# Patient Record
Sex: Male | Born: 1980 | Race: Black or African American | Hispanic: No | Marital: Single | Smoking: Current some day smoker
Health system: Southern US, Community
[De-identification: ages and names within clinical notes are randomized; demographics above are authoritative.]

## PROBLEM LIST (undated history)

## (undated) HISTORY — PX: ROOT CANAL: SHX2363

---
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
## 1998-06-17 ENCOUNTER — Emergency Department (HOSPITAL_COMMUNITY): Admission: EM | Admit: 1998-06-17 | Discharge: 1998-06-17 | Payer: Self-pay | Admitting: Emergency Medicine

## 1998-06-17 ENCOUNTER — Encounter: Payer: Self-pay | Admitting: Emergency Medicine

## 2000-08-07 ENCOUNTER — Inpatient Hospital Stay (HOSPITAL_COMMUNITY): Admission: EM | Admit: 2000-08-07 | Discharge: 2000-08-08 | Payer: Self-pay | Admitting: Emergency Medicine

## 2000-08-07 ENCOUNTER — Encounter: Payer: Self-pay | Admitting: Emergency Medicine

## 2001-08-12 ENCOUNTER — Emergency Department (HOSPITAL_COMMUNITY): Admission: EM | Admit: 2001-08-12 | Discharge: 2001-08-12 | Payer: Self-pay | Admitting: Emergency Medicine

## 2001-08-12 ENCOUNTER — Encounter: Payer: Self-pay | Admitting: Emergency Medicine

## 2002-01-23 ENCOUNTER — Emergency Department (HOSPITAL_COMMUNITY): Admission: EM | Admit: 2002-01-23 | Discharge: 2002-01-23 | Payer: Self-pay | Admitting: Emergency Medicine

## 2003-03-16 ENCOUNTER — Emergency Department (HOSPITAL_COMMUNITY): Admission: EM | Admit: 2003-03-16 | Discharge: 2003-03-16 | Payer: Self-pay | Admitting: Family Medicine

## 2009-11-16 ENCOUNTER — Emergency Department (HOSPITAL_COMMUNITY)
Admission: EM | Admit: 2009-11-16 | Discharge: 2009-11-16 | Payer: Self-pay | Source: Home / Self Care | Admitting: Emergency Medicine

## 2010-06-10 NOTE — H&P (Signed)
[place]  Patient:    Jacob Snow, Jacob Snow                        MRN: [id number] Adm. Date:  [id number] Attending:  Jaci Standard                         History and Physical  CHIEF COMPLAINT:  Nausea and vomiting.  HISTORY OF PRESENT ILLNESS:  A 30 year old black male, no significant past medical history, who started vomiting at 4 p.m. this afternoon.  Had nausea and vomiting x 10, diarrhea x 5.  Had quite a bit of abdominal pain and cramping to begin with, but things have improved quite a bit.  Denied any blood in the diarrhea or vomitus.  It is noted that he had [place] food at lunch.  The historian is the patient, who is very, very sleepy and drugged from all the Phenergan.  PAST MEDICAL HISTORY:  Hospitalizations:  None.  Surgeries:  None.  Illnesses: None.  MEDICATIONS:  None.  ALLERGIES:  None.  SOCIAL HISTORY:  Patient denies smoking, alcohol, or drug usage.  Patient is unmarried, has no children.  Works at _____ [employer].  FAMILY HISTORY:  Mother and father are living, and two sisters, living and well.  No early CAD, cancer, hypertension, diabetes in the family, or early strokes.  REVIEW OF SYSTEMS:  As above.  Also headache, which is improved.  No fevers or chills.  No recent weight loss.  No heart disease.  GASTROINTESTINAL:  As above.  GENITOURINARY:  Negative.  ORTHOPEDIC:  Negative.  PSYCHIATRIC: Negative.  PHYSICAL EXAMINATION:  VITAL SIGNS:  Blood pressure 120/66, pulse 95, respirations 20, temperature 97.7.  GENERAL:  This is a very sleepy black male, very difficult to get much history from him.  He is not in any acute distress at present time but, again, wants to sleep.  HEENT:  Eyes EOMI, PERRLA, fundi are benign.  Mucous membranes are somewhat dry.  NECK:  Supple without nodes, bruits, increased thyroid.  CHEST:  Clear.  CARDIAC:  Regular rate and rhythm without murmur, click, or rub.  ABDOMEN:  Soft,  good bowel sounds, no rebound, guarding, or masses noted. Abdomen is "sore."  EXTREMITIES:  No cyanosis, clubbing, or edema, 2+ pulses, symmetric bilateral.  NEUROLOGIC:  No focal deficits.  Deep tendon reflexes well-preserved.  Sensory and motor function all intact.  Gait is not tested.  LABORATORY DATA:  White count is 15.4 with shift.  Hemoglobin and hematocrit 17.8, 52.3.  Platelets 150.  UA:  Specific gravity greater than 1.040.  CT scan of the abdomen with and without contrast was negative.  Creatinine was 1.4, BUN 17, sodium and potassium 141 and 4.2, remainder of the Stat-8 was negative.  ASSESSMENT:  Gastroenteritis, probable food poisoning.  PLAN:  IV fluids, Phenergan.  For further plan, please see orders. DD:  08/07/00 TD:  08/07/00 Job: [id number]

## 2010-06-22 ENCOUNTER — Emergency Department (HOSPITAL_COMMUNITY)
Admission: EM | Admit: 2010-06-22 | Discharge: 2010-06-22 | Disposition: A | Discharge status: Home / Self Care | Payer: Self-pay | Attending: Emergency Medicine | Admitting: Emergency Medicine

## 2010-06-22 DIAGNOSIS — R05 Cough: Secondary | ICD-10-CM | POA: Insufficient documentation

## 2010-06-22 DIAGNOSIS — R059 Cough, unspecified: Secondary | ICD-10-CM | POA: Insufficient documentation

## 2010-06-22 DIAGNOSIS — J3489 Other specified disorders of nose and nasal sinuses: Secondary | ICD-10-CM | POA: Insufficient documentation

## 2010-06-22 DIAGNOSIS — J029 Acute pharyngitis, unspecified: Secondary | ICD-10-CM | POA: Insufficient documentation

## 2010-06-22 LAB — RAPID STREP SCREEN (MED CTR MEBANE ONLY): Streptococcus, Group A Screen (Direct): NEGATIVE

## 2010-07-03 ENCOUNTER — Emergency Department (HOSPITAL_COMMUNITY)
Admission: EM | Admit: 2010-07-03 | Discharge: 2010-07-03 | Disposition: A | Discharge status: Home / Self Care | Payer: Self-pay | Attending: Emergency Medicine | Admitting: Emergency Medicine

## 2010-07-03 ENCOUNTER — Emergency Department (HOSPITAL_COMMUNITY): Payer: Self-pay

## 2010-07-03 DIAGNOSIS — J029 Acute pharyngitis, unspecified: Secondary | ICD-10-CM | POA: Insufficient documentation

## 2010-07-03 LAB — POCT I-STAT, CHEM 8
Calcium, Ion: 1.2 mmol/L (ref 1.12–1.32)
Chloride: 102 mEq/L (ref 96–112)
HCT: 47 % (ref 39.0–52.0)
Potassium: 4.4 mEq/L (ref 3.5–5.1)
Sodium: 139 mEq/L (ref 135–145)

## 2010-07-03 LAB — RAPID STREP SCREEN (MED CTR MEBANE ONLY): Streptococcus, Group A Screen (Direct): NEGATIVE

## 2010-07-03 MED ORDER — IOHEXOL 300 MG/ML  SOLN: 75.0000 mL | Freq: Once | INTRAMUSCULAR | Status: DC | PRN

## 2011-04-09 ENCOUNTER — Emergency Department (HOSPITAL_COMMUNITY)
Admission: EM | Admit: 2011-04-09 | Discharge: 2011-04-09 | Disposition: A | Discharge status: Home / Self Care | Payer: Self-pay | Attending: Emergency Medicine | Admitting: Emergency Medicine

## 2011-04-09 ENCOUNTER — Emergency Department (HOSPITAL_COMMUNITY): Payer: Self-pay

## 2011-04-09 ENCOUNTER — Other Ambulatory Visit: Payer: Self-pay

## 2011-04-09 ENCOUNTER — Encounter (HOSPITAL_COMMUNITY): Payer: Self-pay | Admitting: *Deleted

## 2011-04-09 DIAGNOSIS — S0180XA Unspecified open wound of other part of head, initial encounter: Secondary | ICD-10-CM | POA: Insufficient documentation

## 2011-04-09 DIAGNOSIS — Y9289 Other specified places as the place of occurrence of the external cause: Secondary | ICD-10-CM | POA: Insufficient documentation

## 2011-04-09 DIAGNOSIS — Z23 Encounter for immunization: Secondary | ICD-10-CM | POA: Insufficient documentation

## 2011-04-09 DIAGNOSIS — S0181XA Laceration without foreign body of other part of head, initial encounter: Secondary | ICD-10-CM

## 2011-04-09 MED ORDER — TETANUS-DIPHTH-ACELL PERTUSSIS 5-2.5-18.5 LF-MCG/0.5 IM SUSP
0.5000 mL | Freq: Once | INTRAMUSCULAR | Status: AC
  Administered 2011-04-09: 0.5 mL via INTRAMUSCULAR
  Filled 2011-04-09: qty 0.5

## 2011-04-09 NOTE — ED Notes (Signed)
Last tetanus shot in the 1990's.
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
## 2011-04-09 NOTE — ED Notes (Signed)
Patient currently asleep in bed; no respiratory or acute distress noted.  Will continue to monitor. 

## 2011-04-09 NOTE — ED Notes (Signed)
Patient back from CT; currently sitting up in bed; no respiratory or acute distress noted.  Patient updated on plan of care; informed patient that we are going to perform an EKG and that the EDP will be in to suture laceration.  Patient has no other questions or concerns at this time; will continue to monitor.

## 2011-04-09 NOTE — Discharge Instructions (Signed)
Laceration Care, Adult A laceration is a cut or lesion that goes through all layers of the skin and into the tissue just beneath the skin. TREATMENT  Some lacerations may not require closure. Some lacerations may not be able to be closed due to an increased risk of infection. It is important to see your caregiver as soon as possible after an injury to minimize the risk of infection and maximize the opportunity for successful closure. If closure is appropriate, pain medicines may be given, if needed. The wound will be cleaned to help prevent infection. Your caregiver will use stitches (sutures), staples, wound glue (adhesive), or skin adhesive strips to repair the laceration. These tools bring the skin edges together to allow for faster healing and a better cosmetic outcome. However, all wounds will heal with a scar. Once the wound has healed, scarring can be minimized by covering the wound with sunscreen during the day for 1 full year. HOME CARE INSTRUCTIONS  For sutures or staples:  Keep the wound clean and dry.   If you were given a bandage (dressing), you should change it at least once a day. Also, change the dressing if it becomes wet or dirty, or as directed by your caregiver.   Wash the wound with soap and water 2 times a day. Rinse the wound off with water to remove all soap. Pat the wound dry with a clean towel.   After cleaning, apply a thin layer of the antibiotic ointment as recommended by your caregiver. This will help prevent infection and keep the dressing from sticking.   You may shower as usual after the first 24 hours. Do not soak the wound in water until the sutures are removed.   Only take over-the-counter or prescription medicines for pain, discomfort, or fever as directed by your caregiver.   Get your sutures or staples removed as directed by your caregiver.  For skin adhesive strips:  Keep the wound clean and dry.   Do not get the skin adhesive strips wet. You may bathe  carefully, using caution to keep the wound dry.   If the wound gets wet, pat it dry with a clean towel.   Skin adhesive strips will fall off on their own. You may trim the strips as the wound heals. Do not remove skin adhesive strips that are still stuck to the wound. They will fall off in time.  For wound adhesive:  You may briefly wet your wound in the shower or bath. Do not soak or scrub the wound. Do not swim. Avoid periods of heavy perspiration until the skin adhesive has fallen off on its own. After showering or bathing, gently pat the wound dry with a clean towel.   Do not apply liquid medicine, cream medicine, or ointment medicine to your wound while the skin adhesive is in place. This may loosen the film before your wound is healed.   If a dressing is placed over the wound, be careful not to apply tape directly over the skin adhesive. This may cause the adhesive to be pulled off before the wound is healed.   Avoid prolonged exposure to sunlight or tanning lamps while the skin adhesive is in place. Exposure to ultraviolet light in the first year will darken the scar.   The skin adhesive will usually remain in place for 5 to 10 days, then naturally fall off the skin. Do not pick at the adhesive film.  You may need a tetanus shot if:  You   cannot remember when you had your last tetanus shot.   You have never had a tetanus shot.  If you get a tetanus shot, your arm may swell, get red, and feel warm to the touch. This is common and not a problem. If you need a tetanus shot and you choose not to have one, there is a rare chance of getting tetanus. Sickness from tetanus can be serious. SEEK MEDICAL CARE IF:   You have redness, swelling, or increasing pain in the wound.   You see a red line that goes away from the wound.   You have yellowish-white fluid (pus) coming from the wound.   You have a fever.   You notice a bad smell coming from the wound or dressing.   Your wound breaks  open before or after sutures have been removed.   You notice something coming out of the wound such as wood or glass.   Your wound is on your hand or foot and you cannot move a finger or toe.  SEEK IMMEDIATE MEDICAL CARE IF:   Your pain is not controlled with prescribed medicine.   You have severe swelling around the wound causing pain and numbness or a change in color in your arm, hand, leg, or foot.   Your wound splits open and starts bleeding.   You have worsening numbness, weakness, or loss of function of any joint around or beyond the wound.   You develop painful lumps near the wound or on the skin anywhere on your body.  MAKE SURE YOU:   Understand these instructions.   Will watch your condition.   Will get help right away if you are not doing well or get worse.  Document Released: 01/09/2005 Document Revised: 12/29/2010 Document Reviewed: 07/05/2010 [facility] Patient Information 2012 [place], [place].Assault, General Assault includes any behavior, whether intentional or reckless, which results in bodily injury to another person and/or damage to property. Included in this would be any behavior, intentional or reckless, that by its nature would be understood (interpreted) by a reasonable person as intent to harm another person or to damage his/her property. Threats may be oral or written. They may be communicated through regular mail, computer, fax, or phone. These threats may be direct or implied. FORMS OF ASSAULT INCLUDE:  Physically assaulting a person. This includes physical threats to inflict physical harm as well as:   Slapping.   Hitting.   Poking.   Kicking.   Punching.   Pushing.   Arson.   Sabotage.   Equipment vandalism.   Damaging or destroying property.   Throwing or hitting objects.   Displaying a weapon or an object that appears to be a weapon in a threatening manner.   Carrying a firearm of any kind.   Using a weapon to harm someone.    Using greater physical size/strength to intimidate another.   Making intimidating or threatening gestures.   Bullying.   Hazing.   Intimidating, threatening, hostile, or abusive language directed toward another person.   It communicates the intention to engage in violence against that person. And it leads a reasonable person to expect that violent behavior may occur.   Stalking another person.  IF IT HAPPENS AGAIN:  Immediately call for emergency help (911 in [place]   If someone poses clear and immediate danger to you, seek legal authorities to have a protective or restraining order put in place.   Less threatening assaults can at least be reported to authorities.  STEPS  TO TAKE IF A SEXUAL ASSAULT HAS HAPPENED  Go to an area of safety. This may include a shelter or staying with a friend. Stay away from the area where you have been attacked. A large percentage of sexual assaults are caused by a friend, relative or associate.   If medications were given by your caregiver, take them as directed for the full length of time prescribed.   Only take over-the-counter or prescription medicines for pain, discomfort, or fever as directed by your caregiver.   If you have come in contact with a sexual disease, find out if you are to be tested again. If your caregiver is concerned about the HIV/AIDS virus, he/she may require you to have continued testing for several months.   For the protection of your privacy, test results can not be given over the phone. Make sure you receive the results of your test. If your test results are not back during your visit, make an appointment with your caregiver to find out the results. Do not assume everything is normal if you have not heard from your caregiver or the medical facility. It is important for you to follow up on all of your test results.   File appropriate papers with authorities. This is important in all assaults, even if it has occurred in a  family or by a friend.  SEEK MEDICAL CARE IF:  You have new problems because of your injuries.   You have problems that may be because of the medicine you are taking, such as:   Rash.   Itching.   Swelling.   Trouble breathing.   You develop belly (abdominal) pain, feel sick to your stomach (nausea) or are vomiting.   You begin to run a temperature.   You need supportive care or referral to a rape crisis center. These are centers with trained personnel who can help you get through this ordeal.  SEEK IMMEDIATE MEDICAL CARE IF:  You are afraid of being threatened, beaten, or abused. In [place] call 911.   You receive new injuries related to abuse.   You develop severe pain in any area injured in the assault or have any change in your condition that concerns you.   You faint or lose consciousness.   You develop chest pain or shortness of breath.  Document Released: 01/09/2005 Document Revised: 12/29/2010 Document Reviewed: 08/28/2007 [facility] Patient Information 2012 [place], [place].

## 2011-04-09 NOTE — ED Notes (Signed)
Two small puncture wounds noted above left eye with some soft tissue swelling, bleeding controlled, patient alert and orientedx3, resting quietly
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
## 2011-04-09 NOTE — ED Notes (Signed)
Pharmacy at bedside
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
## 2011-04-09 NOTE — ED Notes (Signed)
Dr. Bebe Shaggy inserted several sutures to wounds.  Patient understands care for sutures at home.

## 2011-04-09 NOTE — ED Notes (Signed)
Dr. Wickline at bedside.  

## 2012-05-26 ENCOUNTER — Encounter (HOSPITAL_COMMUNITY): Payer: Self-pay | Admitting: *Deleted

## 2012-05-26 ENCOUNTER — Emergency Department (HOSPITAL_COMMUNITY)
Admission: EM | Admit: 2012-05-26 | Discharge: 2012-05-26 | Disposition: A | Discharge status: Home / Self Care | Payer: BC Managed Care – PPO | Attending: Emergency Medicine | Admitting: Emergency Medicine

## 2012-05-26 DIAGNOSIS — K089 Disorder of teeth and supporting structures, unspecified: Secondary | ICD-10-CM | POA: Insufficient documentation

## 2012-05-26 DIAGNOSIS — K0889 Other specified disorders of teeth and supporting structures: Secondary | ICD-10-CM

## 2012-05-26 DIAGNOSIS — K029 Dental caries, unspecified: Secondary | ICD-10-CM | POA: Insufficient documentation

## 2012-05-26 MED ORDER — METHYLPREDNISOLONE SODIUM SUCC 125 MG IJ SOLR
125.0000 mg | Freq: Once | INTRAMUSCULAR | Status: AC
  Administered 2012-05-26: 125 mg via INTRAMUSCULAR
  Filled 2012-05-26: qty 2

## 2012-05-26 MED ORDER — OXYCODONE-ACETAMINOPHEN 5-325 MG PO TABS: 1.0000 | ORAL_TABLET | Freq: Four times a day (QID) | ORAL | Status: DC | PRN

## 2012-05-26 NOTE — ED Notes (Signed)
Pt states having L sided top dental pain, seen dentist for it, was given a filling in tooth, went back d/t pain and dentist did a filling in tooth beside it, didn't address the tooth with the pain, states having swelling in jaw from pain.

## 2012-05-26 NOTE — ED Provider Notes (Signed)
Medical screening examination/treatment/procedure(s) were performed by non-physician practitioner and as supervising physician I was immediately available for consultation/collaboration.  Flint Melter, MD 05/26/12 [phone]

## 2012-05-26 NOTE — ED Provider Notes (Signed)
History     CSN: [id number]  Arrival date & time 05/26/12  1313   First MD Initiated Contact with Patient 05/26/12 1414      Chief Complaint  Patient presents with  . Dental Pain    (Consider location/radiation/quality/duration/timing/severity/associated sxs/prior treatment) HPI  Patient presents to the emergency department with a dental complaint. Symptoms began a few days ago. The patient has tried to alleviate pain with amoxicillin and hydrocodone.  Pain rated at a 10/10, characterized as throbbing in nature and located left upper tooth. Pt has seen two dentist this week. One day they did some dental work but it didn't help, the second visit with a different dentist, he knew the problem but the patient does not have the money to fix it. He is now out of pain and worried ab out swelling.. Patient denies fever, night sweats, chills, difficulty swallowing or opening mouth, SOB, nuchal rigidity or decreased ROM of neck.  Patient does not have a dentist and requests a resource guide at discharge.    History reviewed. No pertinent past medical history.  History reviewed. No pertinent past surgical history.  No family history on file.  History  Substance Use Topics  . Smoking status: Never Smoker   . Smokeless tobacco: Never Used  . Alcohol Use: Yes      Review of Systems  All other systems reviewed and are negative.    Allergies  Review of patient's allergies indicates no known allergies.  Home Medications   Current Outpatient Rx  Name  Route  Sig  Dispense  Refill  . amoxicillin (AMOXIL) 500 MG capsule   Oral   Take 500 mg by mouth 3 (three) times daily.         Marland Kitchen HYDROcodone-ibuprofen (VICOPROFEN) 7.5-200 MG per tablet   Oral   Take 2 tablets by mouth every 6 (six) hours as needed for pain.           BP 131/68  Pulse 64  Temp(Src) 98.4 F (36.9 C) (Oral)  Resp 16  SpO2 98%  Physical Exam  Nursing note and vitals reviewed. Constitutional: He appears  well-developed and well-nourished.  HENT:  Head: Normocephalic and atraumatic.  Mouth/Throat: Dental caries present.    Eyes: Conjunctivae and EOM are normal. Pupils are equal, round, and reactive to light.  Neck: Normal range of motion. Neck supple.  Cardiovascular: Normal rate and regular rhythm.   Pulmonary/Chest: Effort normal and breath sounds normal.    ED Course  Procedures (including critical care time)  Labs Reviewed - No data to display No results found.   No diagnosis found. Dx: toothache   MDM  Patient has dental pain. No emergent s/sx's present. Patent airway. No trismus.  Already on pain medication and antibiotics. Given shot for swelling in ED. I discussed the need to call dentist within 24/48 hours for follow-up. Dental referral given. Return to ED precautions given.  Pt voiced understanding and has agreed to follow-up.          Dorthula Matas, PA-C 05/26/12 1424

## 2012-09-03 ENCOUNTER — Encounter (HOSPITAL_COMMUNITY): Payer: Self-pay

## 2012-09-03 ENCOUNTER — Emergency Department (HOSPITAL_COMMUNITY)
Admission: EM | Admit: 2012-09-03 | Discharge: 2012-09-04 | Disposition: A | Discharge status: Home / Self Care | Payer: BLUE CROSS/BLUE SHIELD | Attending: Emergency Medicine | Admitting: Emergency Medicine

## 2012-09-03 DIAGNOSIS — K029 Dental caries, unspecified: Secondary | ICD-10-CM | POA: Insufficient documentation

## 2012-09-03 DIAGNOSIS — K089 Disorder of teeth and supporting structures, unspecified: Secondary | ICD-10-CM | POA: Diagnosis present

## 2012-09-03 DIAGNOSIS — K047 Periapical abscess without sinus: Secondary | ICD-10-CM | POA: Diagnosis not present

## 2012-09-03 MED ORDER — PENICILLIN V POTASSIUM 500 MG PO TABS: 500.0000 mg | ORAL_TABLET | Freq: Three times a day (TID) | ORAL | Status: DC

## 2012-09-03 MED ORDER — KETOROLAC TROMETHAMINE 60 MG/2ML IM SOLN
60.0000 mg | Freq: Once | INTRAMUSCULAR | Status: AC
  Administered 2012-09-04: 60 mg via INTRAMUSCULAR
  Filled 2012-09-03: qty 2

## 2012-09-03 MED ORDER — HYDROCODONE-IBUPROFEN 7.5-200 MG PO TABS: 2.0000 | ORAL_TABLET | Freq: Four times a day (QID) | ORAL | Status: DC | PRN

## 2012-09-03 NOTE — ED Provider Notes (Signed)
CSN: [id number]     Arrival date & time 09/03/12  2325 History    This chart was scribed for a non-physician practitioner, Fayrene Helper, PA-C, working with Jones Skene, MD by Frederik Pear, ED Scribe. This patient was seen in room WTR6/WTR6 and the patient's care was started at 2329.   First MD Initiated Contact with Patient 09/03/12 2329     Chief Complaint  Patient presents with  . Dental Pain   (Consider location/radiation/quality/duration/timing/severity/associated sxs/prior Treatment) The history is provided by the patient and medical records. No language interpreter was used.   HPI Comments: Jacob Snow is a 32 y.o. male who presents to the Emergency Department complaining of worsening left-sided lower dental pain thatis aggravated by eating sweet foods and alleviated by nothing that began 1 day ago. He has a h/o of similar pain to the same tooth 4-5 months ago. He was seen in ED and given a course of amoxicillin and hydrocodone and dentist referral, but did not follow up because the pain stopped. He reports he has an appointment with an oral surgeon, Dr. Veryl Speak, in 3 days. He has treated the pain at home by gargling salt water and peroxide. He also took  [facility] powder without relief.   History reviewed. No pertinent past medical history. History reviewed. No pertinent past surgical history. History reviewed. No pertinent family history. History  Substance Use Topics  . Smoking status: Never Smoker   . Smokeless tobacco: Never Used  . Alcohol Use: Yes    Review of Systems  HENT: Positive for dental problem.   All other systems reviewed and are negative.   Allergies  Review of patient's allergies indicates no known allergies.  Home Medications   Current Outpatient Rx  Name  Route  Sig  Dispense  Refill  . amoxicillin (AMOXIL) 500 MG capsule   Oral   Take 500 mg by mouth 3 (three) times daily.         Marland Kitchen HYDROcodone-ibuprofen (VICOPROFEN) 7.5-200 MG per tablet   Oral   Take 2 tablets by mouth every 6 (six) hours as needed for pain.         Marland Kitchen oxyCODONE-acetaminophen (PERCOCET/ROXICET) 5-325 MG per tablet   Oral   Take 1 tablet by mouth every 6 (six) hours as needed for pain.   12 tablet   0    BP 134/74  Pulse 64  Temp(Src) 99.6 F (37.6 C) (Oral)  Resp 18  SpO2 99% Physical Exam  Nursing note and vitals reviewed. Constitutional: He is oriented to person, place, and time. He appears well-developed and well-nourished. No distress.  HENT:  Head: Normocephalic and atraumatic. No trismus in the jaw.  Mouth/Throat: Uvula is midline and oropharynx is clear and moist. No oral lesions. Abnormal dentition. No dental abscesses. No oropharyngeal exudate, posterior oropharyngeal edema, posterior oropharyngeal erythema or tonsillar abscesses.  Marked dental decay on the left lower first molar. Moderate tenderness to the adjacent buccal region.  Eyes: EOM are normal. Pupils are equal, round, and reactive to light.  Neck: Normal range of motion. Neck supple. No tracheal deviation present.  Cardiovascular: Normal rate.   Pulmonary/Chest: Effort normal. No respiratory distress.  Abdominal: Soft. He exhibits no distension.  Musculoskeletal: Normal range of motion. He exhibits no edema.  Neurological: He is alert and oriented to person, place, and time.  Skin: Skin is warm and dry.  Psychiatric: He has a normal mood and affect. His behavior is normal.    ED Course  Procedures (including critical care time)  DIAGNOSTIC STUDIES: Oxygen Saturation is 99% on room air, normal by my interpretation.    COORDINATION OF CARE:  23:52- Discussed planned course of treatment in the ED with the patient, including Toradol and Penicillin, who is agreeable at this time. Discharge instructions for the pt include keeping his appointment with the oral surgeon and returning if he develops difficulty swallowing, breathing, or fever. He is agreeable at this time.  Medications   ketorolac (TORADOL) injection 60 mg (not administered)   Labs Reviewed - No data to display No results found. 1. Periapical abscess with facial involvement     MDM  BP 134/74  Pulse 64  Temp(Src) 99.6 F (37.6 C) (Oral)  Resp 18  SpO2 99%   I personally performed the services described in this documentation, which was scribed in my presence. The recorded information has been reviewed and is accurate.     Fayrene Helper, PA-C 09/04/12 0000

## 2012-09-03 NOTE — ED Notes (Signed)
Pt complains of wisdom tooth pain, he's currently on vicodin and amoxicillian, has an appt Friday to see an oral surgeon to have tooth extracted.

## 2012-09-04 ENCOUNTER — Emergency Department (HOSPITAL_COMMUNITY)
Admission: EM | Admit: 2012-09-04 | Discharge: 2012-09-04 | Disposition: A | Discharge status: Home / Self Care | Payer: BC Managed Care – PPO | Attending: Emergency Medicine | Admitting: Emergency Medicine

## 2012-09-04 ENCOUNTER — Encounter (HOSPITAL_COMMUNITY): Payer: Self-pay | Admitting: Emergency Medicine

## 2012-09-04 DIAGNOSIS — K089 Disorder of teeth and supporting structures, unspecified: Secondary | ICD-10-CM | POA: Insufficient documentation

## 2012-09-04 DIAGNOSIS — K0889 Other specified disorders of teeth and supporting structures: Secondary | ICD-10-CM

## 2012-09-04 DIAGNOSIS — K029 Dental caries, unspecified: Secondary | ICD-10-CM | POA: Insufficient documentation

## 2012-09-04 MED ORDER — KETOROLAC TROMETHAMINE 30 MG/ML IJ SOLN
30.0000 mg | Freq: Once | INTRAMUSCULAR | Status: AC
  Administered 2012-09-04: 30 mg via INTRAMUSCULAR
  Filled 2012-09-04: qty 1

## 2012-09-04 NOTE — ED Provider Notes (Signed)
CSN: [id number]     Arrival date & time 09/04/12  1526 History     First MD Initiated Contact with Patient 09/04/12 1539     Chief Complaint  Patient presents with  . Dental Pain   (Consider location/radiation/quality/duration/timing/severity/associated sxs/prior Treatment) HPI  Pt returns to the ER today for the same "pain shot" he received yesterday. Toradol 30mg  IM for his dental pain. He see's an oral surgeon tomorrow but the Percocet he was prescribed is not helping his pain. The pain is aggravated by eating.  Patient presents to the emergency department with a dental complaint. Symptoms began 3 days ago. The patient has tried to alleviate pain with Percocet and Amoxicillin.  Pain rated at a 10/10, characterized as throbbing in nature and located left lower side. Patient denies fever, night sweats, chills, difficulty swallowing or opening mouth, SOB, nuchal rigidity or decreased ROM of neck.  Patient does not have a dentist and requests a resource guide at discharge.   History reviewed. No pertinent past medical history. History reviewed. No pertinent past surgical history. History reviewed. No pertinent family history. History  Substance Use Topics  . Smoking status: Never Smoker   . Smokeless tobacco: Never Used  . Alcohol Use: Yes     Comment: occ    Review of Systems ROS is negative unless otherwise stated in the HPI  Allergies  Review of patient's allergies indicates no known allergies.  Home Medications   Current Outpatient Rx  Name  Route  Sig  Dispense  Refill  . HYDROcodone-ibuprofen (VICOPROFEN) 7.5-200 MG per tablet   Oral   Take 2 tablets by mouth every 6 (six) hours as needed for pain.   12 tablet   0   . ibuprofen (ADVIL,MOTRIN) 200 MG tablet   Oral   Take 600 mg by mouth every 6 (six) hours as needed for pain.         Marland Kitchen penicillin v potassium (VEETID) 500 MG tablet   Oral   Take 1 tablet (500 mg total) by mouth 3 (three) times daily.   30  tablet   0    BP 141/88  Pulse 63  Temp(Src) 98.8 F (37.1 C) (Oral)  SpO2 100% Physical Exam  Nursing note and vitals reviewed. Constitutional: He appears well-developed and well-nourished.  HENT:  Head: Normocephalic and atraumatic.  Mouth/Throat: Dental caries present.    Eyes: Conjunctivae and EOM are normal. Pupils are equal, round, and reactive to light.  Neck: Normal range of motion. Neck supple.  Cardiovascular: Normal rate and regular rhythm.   Pulmonary/Chest: Effort normal and breath sounds normal.    ED Course   Procedures (including critical care time)  Labs Reviewed - No data to display No results found. 1. Toothache     MDM  Patient has dental pain. No emergent s/sx's present. Patent airway. No trismus.  Will be given pain medication and antibiotics. I discussed the need to call dentist within 24/48 hours for follow-up. Dental referral given. Return to ED precautions given.  Pt voiced understanding and has agreed to follow-up.   32 y.o.Rafiq Lawler's evaluation in the Emergency Department is complete. It has been determined that no acute conditions requiring further emergency intervention are present at this time. The patient/guardian have been advised of the diagnosis and plan. We have discussed signs and symptoms that warrant return to the ED, such as changes or worsening in symptoms.  Vital signs are stable at discharge. Filed Vitals:   09/04/12 1534  BP: 141/88  Pulse: 63  Temp: 98.8 F (37.1 C)    Patient/guardian has voiced understanding and agreed to follow-up with the PCP or specialist.   Dorthula Matas, PA-C 09/04/12 1600

## 2012-09-04 NOTE — ED Provider Notes (Signed)
Medical screening examination/treatment/procedure(s) were performed by non-physician practitioner and as supervising physician I was immediately available for consultation/collaboration.  John-Adam Kadija Cruzen, M.D.     John-Adam Darius Lundberg, MD 09/04/12 0600 

## 2012-09-04 NOTE — ED Provider Notes (Signed)
Medical screening examination/treatment/procedure(s) were performed by non-physician practitioner and as supervising physician I was immediately available for consultation/collaboration.  Layla Maw Ward, DO 09/04/12 1621

## 2012-09-04 NOTE — ED Notes (Signed)
Toradol 30 mg IM to left deltoid.  Patient tolerated well. Bandaid to site.

## 2013-05-28 ENCOUNTER — Emergency Department (HOSPITAL_COMMUNITY)
Admission: EM | Admit: 2013-05-28 | Discharge: 2013-05-28 | Disposition: A | Discharge status: Home / Self Care | Payer: BC Managed Care – PPO | Attending: Emergency Medicine | Admitting: Emergency Medicine

## 2013-05-28 ENCOUNTER — Encounter (HOSPITAL_COMMUNITY): Payer: Self-pay | Admitting: Emergency Medicine

## 2013-05-28 DIAGNOSIS — G479 Sleep disorder, unspecified: Secondary | ICD-10-CM | POA: Insufficient documentation

## 2013-05-28 DIAGNOSIS — H9209 Otalgia, unspecified ear: Secondary | ICD-10-CM | POA: Insufficient documentation

## 2013-05-28 DIAGNOSIS — J3489 Other specified disorders of nose and nasal sinuses: Secondary | ICD-10-CM | POA: Insufficient documentation

## 2013-05-28 DIAGNOSIS — K0889 Other specified disorders of teeth and supporting structures: Secondary | ICD-10-CM

## 2013-05-28 DIAGNOSIS — Z792 Long term (current) use of antibiotics: Secondary | ICD-10-CM | POA: Insufficient documentation

## 2013-05-28 DIAGNOSIS — K089 Disorder of teeth and supporting structures, unspecified: Secondary | ICD-10-CM | POA: Insufficient documentation

## 2013-05-28 MED ORDER — AMOXICILLIN 500 MG PO CAPS: 500.0000 mg | ORAL_CAPSULE | Freq: Three times a day (TID) | ORAL | Status: DC

## 2013-05-28 NOTE — ED Provider Notes (Signed)
CSN: [id number]     Arrival date & time 05/28/13  1739 History  This chart was scribed for non-physician practitioner, Roxy Horsemanobert Yohan Samons, PA-C, working with Ethelda ChickMartha K Linker, MD by Charline BillsEssence Howell, ED Scribe. This patient was seen in room WTR6/WTR6 and the patient's care was started at 7:17 PM.    Chief Complaint  Patient presents with  . Dental Pain  . Nasal Congestion    The history is provided by the patient. No language interpreter was used.   HPI Comments: Rod MaeDaimen Bazzle is a 33 y.o. male who presents to the Emergency Department with chief complaint of left sided dental pain onset today. Pt also reports associated sinus pressure, ear pain and jaw pain onset 2 days ago. Pt has tried ibuprofen and putting a sealant on his tooth with no relief. He has not recently seen a dentist; last visit was in August. Pt does not have a h/o allergies to any antibiotics.   History reviewed. No pertinent past medical history. No past surgical history on file. No family history on file. History  Substance Use Topics  . Smoking status: Never Smoker   . Smokeless tobacco: Never Used  . Alcohol Use: Yes     Comment: occ    Review of Systems  Constitutional: Negative for fever and chills.  HENT: Positive for dental problem, ear pain and sinus pressure. Negative for drooling.   Neurological: Negative for speech difficulty.  Psychiatric/Behavioral: Positive for sleep disturbance.     Allergies  Review of patient's allergies indicates no known allergies.  Home Medications   Prior to Admission medications   Medication Sig Start Date End Date Taking? Authorizing Provider  HYDROcodone-ibuprofen (VICOPROFEN) 7.5-200 MG per tablet Take 2 tablets by mouth every 6 (six) hours as needed for pain. 09/03/12   Fayrene HelperBowie Tran, PA-C  ibuprofen (ADVIL,MOTRIN) 200 MG tablet Take 600 mg by mouth every 6 (six) hours as needed for pain.    Historical Provider, MD  penicillin v potassium (VEETID) 500 MG tablet Take 1 tablet  (500 mg total) by mouth 3 (three) times daily. 09/03/12   Fayrene HelperBowie Tran, PA-C   Triage Vitals: BP 139/90  Pulse 59  Temp(Src) 98.4 F (36.9 C) (Oral)  Resp 12  SpO2 100% Physical Exam  Nursing note and vitals reviewed. Constitutional: He is oriented to person, place, and time. He appears well-developed and well-nourished.  HENT:  Head: Normocephalic and atraumatic.  Mouth/Throat:    Poor dentition throughout.  Affected tooth as diagrammed.  No signs of peritonsillar or tonsillar abscess.  No signs of gingival abscess. Oropharynx is clear and without exudates.  Uvula is midline.  Airway is intact. No signs of Ludwig's angina with palpation of oral and sublingual mucosa.   Eyes: Conjunctivae and EOM are normal.  Neck: Normal range of motion.  Cardiovascular: Normal rate.   Pulmonary/Chest: Effort normal.  Abdominal: He exhibits no distension.  Musculoskeletal: Normal range of motion.  Neurological: He is alert and oriented to person, place, and time.  Skin: Skin is dry.  Psychiatric: He has a normal mood and affect. His behavior is normal. Judgment and thought content normal.    ED Course  Procedures (including critical care time) DIAGNOSTIC STUDIES: Oxygen Saturation is 100% on RA, normal by my interpretation.    COORDINATION OF CARE: 7:19 PM-Discussed treatment plan which includes antibiotic with pt at bedside and pt agreed to plan.   Labs Review Labs Reviewed - No data to display  Imaging Review No results found.  EKG Interpretation None      MDM   Final diagnoses:  Pain, dental    Patient with toothache.  No gross abscess.  Exam unconcerning for Ludwig's angina or spread of infection.  Will treat with amoxicillin and tylenol/ibuprofen.  Urged patient to follow-up with dentist.     I personally performed the services described in this documentation, which was scribed in my presence. The recorded information has been reviewed and is accurate.    Roxy Horsemanobert  Aerie Donica, PA-C 05/28/13 1936

## 2013-05-28 NOTE — Discharge Instructions (Signed)

## 2013-05-28 NOTE — ED Notes (Signed)
Pt states that he has had nasal congestion x 2 days.  Dental pain x 1 day.

## 2013-05-28 NOTE — ED Provider Notes (Signed)
Medical screening examination/treatment/procedure(s) were performed by non-physician practitioner and as supervising physician I was immediately available for consultation/collaboration.   EKG Interpretation None       Martha K Linker, MD 05/28/13 1938 
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
## 2013-11-11 ENCOUNTER — Encounter (HOSPITAL_COMMUNITY): Payer: Self-pay | Admitting: Emergency Medicine

## 2013-11-11 ENCOUNTER — Emergency Department (HOSPITAL_COMMUNITY)
Admission: EM | Admit: 2013-11-11 | Discharge: 2013-11-11 | Disposition: A | Discharge status: Home / Self Care | Payer: BC Managed Care – PPO | Attending: Emergency Medicine | Admitting: Emergency Medicine

## 2013-11-11 DIAGNOSIS — K029 Dental caries, unspecified: Secondary | ICD-10-CM | POA: Insufficient documentation

## 2013-11-11 DIAGNOSIS — Z792 Long term (current) use of antibiotics: Secondary | ICD-10-CM | POA: Insufficient documentation

## 2013-11-11 DIAGNOSIS — K047 Periapical abscess without sinus: Secondary | ICD-10-CM

## 2013-11-11 MED ORDER — AMOXICILLIN 500 MG PO CAPS: 500.0000 mg | ORAL_CAPSULE | Freq: Three times a day (TID) | ORAL | Status: DC

## 2013-11-11 MED ORDER — IBUPROFEN 600 MG PO TABS: 600.0000 mg | ORAL_TABLET | Freq: Four times a day (QID) | ORAL | Status: DC | PRN

## 2013-11-11 MED ORDER — HYDROCODONE-ACETAMINOPHEN 5-325 MG PO TABS
1.0000 | ORAL_TABLET | ORAL | Status: DC | PRN
Start: 2013-11-11 — End: 2017-06-21

## 2013-11-11 NOTE — Discharge Instructions (Signed)
Ibuprofen for pain. norco for severe pain. Amoxil until all gone for the infection. Follow up with a dentist as referred.    Abscess An abscess is an infected area that contains a collection of pus and debris.It can occur in almost any part of the body. An abscess is also known as a furuncle or boil. CAUSES  An abscess occurs when tissue gets infected. This can occur from blockage of oil or sweat glands, infection of hair follicles, or a minor injury to the skin. As the body tries to fight the infection, pus collects in the area and creates pressure under the skin. This pressure causes pain. People with weakened immune systems have difficulty fighting infections and get certain abscesses more often.  SYMPTOMS Usually an abscess develops on the skin and becomes a painful mass that is red, warm, and tender. If the abscess forms under the skin, you may feel a moveable soft area under the skin. Some abscesses break open (rupture) on their own, but most will continue to get worse without care. The infection can spread deeper into the body and eventually into the bloodstream, causing you to feel ill.  DIAGNOSIS  Your caregiver will take your medical history and perform a physical exam. A sample of fluid may also be taken from the abscess to determine what is causing your infection. TREATMENT  Your caregiver may prescribe antibiotic medicines to fight the infection. However, taking antibiotics alone usually does not cure an abscess. Your caregiver may need to make a small cut (incision) in the abscess to drain the pus. In some cases, gauze is packed into the abscess to reduce pain and to continue draining the area. HOME CARE INSTRUCTIONS   Only take over-the-counter or prescription medicines for pain, discomfort, or fever as directed by your caregiver.  If you were prescribed antibiotics, take them as directed. Finish them even if you start to feel better.  If gauze is used, follow your caregiver's  directions for changing the gauze.  To avoid spreading the infection:  Keep your draining abscess covered with a bandage.  Wash your hands well.  Do not share personal care items, towels, or whirlpools with others.  Avoid skin contact with others.  Keep your skin and clothes clean around the abscess.  Keep all follow-up appointments as directed by your caregiver. SEEK MEDICAL CARE IF:   You have increased pain, swelling, redness, fluid drainage, or bleeding.  You have muscle aches, chills, or a general ill feeling.  You have a fever. MAKE SURE YOU:   Understand these instructions.  Will watch your condition.  Will get help right away if you are not doing well or get worse. Document Released: 10/19/2004 Document Revised: 07/11/2011 Document Reviewed: 03/24/2011 [facility] Patient Information 2015 [place], [place]. This information is not intended to replace advice given to you by your health care provider. Make sure you discuss any questions you have with your health care provider.

## 2013-11-11 NOTE — ED Provider Notes (Signed)
CSN: [id number]     Arrival date & time 11/11/13  1226 History  This chart was scribed for Jacob Musselatyana A Vedh Ptacek, PA with Shon Batonourtney F Horton, MD by Tonye RoyaltyJoshua Chen, ED Scribe. This patient was seen in room WTR5/WTR5 and the patient's care was started at 1:08 PM.    No chief complaint on file.  The history is provided by the patient. No language interpreter was used.    HPI Comments: Jacob Snow is a 33 y.o. male who presents to the Emergency Department complaining of recurrent pain and swelling to his left lower jaw with onset yesterday. He states he does not normally use sugar but states he had some with coffee recently. He states his dentist recommended that he have a root canal a few months ago, but he did not have it done because of the cost. He reports using amoxicillin previously with remission. He denies fever.  No past medical history on file. No past surgical history on file. No family history on file. History  Substance Use Topics  . Smoking status: Never Smoker   . Smokeless tobacco: Never Used  . Alcohol Use: Yes     Comment: occ    Review of Systems  Constitutional: Negative for fever and chills.  HENT: Positive for dental problem and facial swelling.       Allergies  Review of patient's allergies indicates no known allergies.  Home Medications   Prior to Admission medications   Medication Sig Start Date End Date Taking? Authorizing Provider  amoxicillin (AMOXIL) 500 MG capsule Take 1 capsule (500 mg total) by mouth 3 (three) times daily. 05/28/13   Roxy Horsemanobert Browning, PA-C  HYDROcodone-ibuprofen (VICOPROFEN) 7.5-200 MG per tablet Take 2 tablets by mouth every 6 (six) hours as needed for pain. 09/03/12   Fayrene HelperBowie Tran, PA-C  ibuprofen (ADVIL,MOTRIN) 200 MG tablet Take 600 mg by mouth every 6 (six) hours as needed for pain.    Historical Provider, MD  penicillin v potassium (VEETID) 500 MG tablet Take 1 tablet (500 mg total) by mouth 3 (three) times daily. 09/03/12   Fayrene HelperBowie Tran,  PA-C   There were no vitals taken for this visit. Physical Exam  Nursing note and vitals reviewed. Constitutional: He is oriented to person, place, and time. He appears well-developed and well-nourished.  HENT:  Head: Normocephalic and atraumatic.  Left mandibular swelling. There is a dental care he is in the left lower first molar. It is tender to palpation. There is surrounding gum swelling, abscess. There is no swelling under the tongue. There is no trismus. No submandibular lymphadenopathy  Eyes: Conjunctivae are normal.  Neck: Normal range of motion. Neck supple.  Pulmonary/Chest: Effort normal.  Musculoskeletal: Normal range of motion.  Neurological: He is alert and oriented to person, place, and time.  Skin: Skin is warm and dry.  Psychiatric: He has a normal mood and affect.    ED Course  Procedures (including critical care time) Labs Review Labs Reviewed - No data to display  Imaging Review No results found.   EKG Interpretation None      COORDINATION OF CARE: 1:11 PM Discussed treatment plan with patient at beside, including amoxicillin and referral to an oral surgeon. Instructed him to return to the ED if symptoms do not improve while on antibiotics. The patient agrees with the plan and has no further questions at this time.   MDM   Final diagnoses:  None    Patient with a dental abscess. History of the same  in the same tooth. He has seen a dentist in the past who told him he needed a root canal, but patient did not followup. I this time he is afebrile, nontoxic appearing, and evidence of sepsis. No signs of Ludwig's angina. We'll start amoxicillin, pain management. Followup with a dentist, oral surgery referral given  Filed Vitals:   11/11/13 1300  BP: 105/52  Pulse: 66  Temp: 98.7 F (37.1 C)  TempSrc: Oral  Resp: 18  SpO2: 99%     I personally performed the services described in this documentation, which was scribed in my presence. The recorded  information has been reviewed and is accurate.    Jacob Musselatyana A Sebrena Engh, PA-C 11/11/13 1325

## 2013-11-11 NOTE — ED Notes (Signed)
Recurrent swelling and  pain in l/lower jaw. Area in l/lower jaw swollen, pt reports pain x 12 hours. Has been seen by dentist a few months ago and stated that root canal was recommended

## 2013-11-11 NOTE — ED Provider Notes (Signed)
Medical screening examination/treatment/procedure(s) were performed by non-physician practitioner and as supervising physician I was immediately available for consultation/collaboration.   EKG Interpretation None        Jacob Wesche F Hue Steveson, MD 11/11/13 1900 

## 2013-11-12 ENCOUNTER — Encounter (HOSPITAL_COMMUNITY): Payer: Self-pay | Admitting: Emergency Medicine

## 2013-11-12 ENCOUNTER — Emergency Department (HOSPITAL_COMMUNITY)
Admission: EM | Admit: 2013-11-12 | Discharge: 2013-11-12 | Disposition: A | Discharge status: Home / Self Care | Payer: BC Managed Care – PPO | Attending: Emergency Medicine | Admitting: Emergency Medicine

## 2013-11-12 DIAGNOSIS — T85848D Pain due to other internal prosthetic devices, implants and grafts, subsequent encounter: Secondary | ICD-10-CM

## 2013-11-12 DIAGNOSIS — K088 Other specified disorders of teeth and supporting structures: Secondary | ICD-10-CM | POA: Insufficient documentation

## 2013-11-12 DIAGNOSIS — Z965 Presence of tooth-root and mandibular implants: Secondary | ICD-10-CM | POA: Insufficient documentation

## 2013-11-12 DIAGNOSIS — Z720 Tobacco use: Secondary | ICD-10-CM | POA: Insufficient documentation

## 2013-11-12 DIAGNOSIS — Z792 Long term (current) use of antibiotics: Secondary | ICD-10-CM | POA: Insufficient documentation

## 2013-11-12 NOTE — ED Notes (Signed)
Pt c/o LL facial swelling and pain in jaw from dental abscess x 2 days and depression x 1 month.  Pain score 6/10. Pt was seen yesterday for abscess.  Sts seen by dentist a few months ago and stated that root canal was recommended.  Sts took antibiotics as prescribed, but would like it drained.  Denies SI/HI/Hallucinations.  Sts "I've been going through a rough period."

## 2013-11-12 NOTE — ED Notes (Signed)
Pt not in room after speaking with EDP

## 2013-11-12 NOTE — ED Notes (Signed)
MD at bedside. EDP KOHUT AT BS SPEAKING WITH PT

## 2013-11-12 NOTE — Discharge Instructions (Signed)
°Emergency Department Resource Guide °1) Find a Doctor and Pay Out of Pocket °Although you won't have to find out who is covered by your insurance plan, it is a good idea to ask around and get recommendations. You will then need to call the office and see if the doctor you have chosen will accept you as a new patient and what types of options they offer for patients who are self-pay. Some doctors offer discounts or will set up payment plans for their patients who do not have insurance, but you will need to ask so you aren't surprised when you get to your appointment. ° °2) Contact Your Local Health Department °Not all health departments have doctors that can see patients for sick visits, but many do, so it is worth a call to see if yours does. If you don't know where your local health department is, you can check in your phone book. The CDC also has a tool to help you locate your state's health department, and many state websites also have listings of all of their local health departments. ° °3) Find a Walk-in Clinic °If your illness is not likely to be very severe or complicated, you may want to try a walk in clinic. These are popping up all over the country in pharmacies, drugstores, and shopping centers. They're usually staffed by nurse practitioners or physician assistants that have been trained to treat common illnesses and complaints. They're usually fairly quick and inexpensive. However, if you have serious medical issues or chronic medical problems, these are probably not your best option. ° °No Primary Care Doctor: °- Call [facility] at  832-8000 - they can help you locate a primary care doctor that  accepts your insurance, provides certain services, etc. °- Physician Referral Service- 1-800-533-3463 ° °Chronic Pain Problems: °Organization         Address  Phone   Notes  °[facility]  (336) 297-2271 Patients need to be referred by their primary care doctor.  ° °Medication  Assistance: °Organization         Address  Phone   Notes  °[place] [facility] [address] °[place], [place] [address] (336) 641-8030 --Must be a resident of [place] °-- Must have NO insurance coverage whatsoever (no Medicaid/ Medicare, etc.) °-- The pt. MUST have a primary care doctor that directs their care regularly and follows them in the community °  °[facility]  (866) 331-1348   °[employer]  (888) 892-1162   ° °Agencies that provide inexpensive medical care: °Organization         Address  Phone   Notes  °[facility]  (336) 832-8035   °[facility]    (336) 832-7272   °[facility] [address] °[place], [place] [address] (336) 832-4777   °[facility] [address], °[place] (336) 271-4999   °[facility]    (336) 373-0678   °[facility]    (336) 272-1050   °[facility] ° [address], [place] Phone:  (336) 832-4444, Fax:  (336) 832-4440 Hours of Operation:  9 am - 6 pm, M-F.  Also accepts Medicaid/Medicare and self-pay.  °[facility] ° [address], [place] Phone: (336) 832-3150, Fax: (336) 832-3151. Hours of Operation:  8:30 am - 5:30 pm, M-F.  Also accepts Medicaid and self-pay.  °[facility] [place] [address], [place] Phone: (336) 878-6027   °[facility] [address], [place], [place] (336)723-1848, Ext. 123 Mondays & Thursdays: 7-9 AM.  First 15 patients are seen on a first come, first serve basis. °  ° °Medicaid-accepting [place] Providers: ° °Organization         Address  Phone   Notes  °[facility] [address], [place] (336) 641-2100 Also accepts self-pay patients.  °[facility] [address], [place] ° (336) 856-9996   °[facility] [address], [place]  (336) 288-8857   °[facility] [address] [place] [address], [place] (336) 299-7000   °Veita Bland [address], [place]  ° (336) 373-1557 Only accepts [place] Access Medicaid patients after they have their name applied to their card.  ° °Self-Pay (no insurance) in [place]: ° °Organization         Address  Phone   Notes  °Sickle Cell Patients, [facility] [address], [place] (336) 832-1970   °[facility] [address], [place] (336) 832-4400   °[facility] [place] ° [address], [place] (336) 992-4800   °[facility]/Dr. Osei-Bonsu ° [address] [place] [address], [place] or [address], [place] (336) 841-8500 Phone number for both [place] and [place] locations is the same.  °[facility] [address], [place] (336) 299-0000   °[facility] [address] [place] [address], [place] or [address] (336) 852-7530 °(336) 878-2260   °[facility] [address], [place] (336) 350-1642, phone; (336) 294-5005, fax Sees patients 1st and 3rd Saturday of every month.  Must not qualify for public or private insurance (i.e. Medicaid, Medicare, [place] Health Choice, Veterans' Benefits) • Household income should be no more than 200% of the poverty level •The clinic cannot treat you if you are pregnant or think you are pregnant • Sexually transmitted diseases are not treated at the clinic.  ° ° °Dental Care: °Organization         Address  Phone  Notes  °[place] [facility] [address], [place] (336) 641-6152 Accepts children up to age 21 who are enrolled in Medicaid or [place] Health Choice; pregnant women with a Medicaid card; and children who have applied for Medicaid or [place] Health Choice, but were declined, whose parents can pay a reduced fee at time of service.  °[place]  [facility] [place]  [address], [place] (336) 641-7733 Accepts children up to age 21 who are enrolled in Medicaid or [place] Health Choice; pregnant women with a Medicaid card; and children who have applied for Medicaid or [place] Health Choice, but were declined, whose parents can pay a reduced fee at time of service.  °[facility] ° [address], [place] (336) 641-4533 Patients are seen by appointment only. Walk-ins are not accepted. [facility] will see patients 18 years of age and older. °Monday - Tuesday (8am-5pm) °Most Wednesdays (8:30-5pm) °$30 per visit, cash only  °[facility] ° [address], [place] (336) 641-4533 Patients are seen by appointment only. Walk-ins are not accepted. [facility] will see patients 18 years of age and older. °One   Wednesday Evening (Monthly: Volunteer Based).  $30 per visit, cash only  °[facility]  (919) 537-3737 for adults; Children under age 4, call Graduate Pediatric Dentistry at (919) [phone]. Children aged 4-14, please call (919) 537-3737 to request a pediatric application. ° Dental services are provided in all areas of dental care including fillings, crowns and bridges, complete and partial dentures, implants, gum treatment, root canals, and extractions. Preventive care is also provided. Treatment is provided to both adults and children. °Patients are selected via a lottery and there is often a waiting list. °  °[facility] [address], °[place] ° (336) 763-8833 [email or web address] °  °[facility] [address], [place], [place] (336)723-1848, Ext. 123 Second and Fourth Thursday of each month, opens at 6:30 AM; Clinic ends at 9 AM.  Patients are seen on a first-come first-served basis, and a limited number are seen during each clinic.  ° °[facility] ° [address], [place], [place] (336) 723-7904    Eligibility Requirements °You must have lived in [place], [place], or [place] counties for at least the last three months. °  You cannot be eligible for state or federal sponsored healthcare insurance, including Veterans Administration, Medicaid, or Medicare. °  You generally cannot be eligible for healthcare insurance through your employer.  °  How to apply: °Eligibility screenings are held every Tuesday and Wednesday afternoon from 1:00 pm until 4:00 pm. You do not need an appointment for the interview!  °[facility] [address], [place], [place] 336-631-2330   °[facility] [facility]  336-342-8273   °[facility]  336-703-3100   °[facility]  336-570-6415   ° °Behavioral Health Resources in the Community: °Intensive Outpatient Programs °Organization         Address  Phone  Notes  °[facility] [address], [place], [place] 336-878-6098   °[facility] [address], [place], [place] 336-832-9800   °[facility] [address], [place], [place] ° 336-882-2125   °[facility] [address],  °[place], [place] 1-800-853-5163 or 336-641-4981   °Substance Abuse Resources °Organization         Address  Phone  Notes  °[facility]  336-882-2125   °[facility]  336-784-9470   °[facility]  336-285-9073   °[facility]  336-845-3988   °Residential & Outpatient Substance Abuse Program  1-800-659-3381   °Psychological Services °Organization         Address  Phone  Notes  °[facility]  336- 832-9600   °[facility]  336- 378-7881   °[facility] [address], [place] 1-800-853-5163 or 336-641-4981   ° °Mobile Crisis Teams °Organization         Address  Phone  Notes  °[facility], Mobile Crisis Care Unit  1-877-626-1772   °Assertive °[facility] ° [address]  [place], [place] 336-834-9664   °Sharon DeEsch [address] °[place] 336-554-5454   ° °Self-Help/Support Groups °Organization         Address  Phone             Notes  °[facility]  - variety of support groups  336- 373-1402 Call for more information  °Narcotics Anonymous (NA), [facility] [address], °[place]  2 meetings at this location  ° °  Residential Treatment Programs °Organization         Address  Phone  Notes  °[facility] [address],    °[place]  1-866-801-8205   °[facility] ° [address], [place], [place] 704-293-8524   °[facility] [address], [place] 336-845-3988 Admissions: 8am-3pm M-F  °[facility] [address]    °[place], [place] 336-841-1104   °[facility] [address], [place], [place] 336-379-7146   °[facility] [address]  °[place], [place] 336-285-9073   °[facility] Intensive Outpatient [address], [place], [place] 336-852-3033   °[facility] [address]  °[place], [place] 1-877-615-2722 or 336-784-9470   °[facility] [address] [place], [place] 336-227-7417 Accepts Medicaid  °[facility] [address]  °[place] 1-800-659-3381 Substance Abuse/Addiction Treatment  ° °[place] Behavioral Health Resources °Organization         Address  Phone  Notes  °[facility]  (888) 581-9988   °Julie Brannon, PhD [address] [place], [place]   (336) 349-5553 or (336) 951-0000   ° Behavioral   [address] °[place], [place] (336) 349-4454   °[facility] [address], [place], [place] (336) 342-8316 Insurance/Medicaid/sponsorship through [facility]  °[facility] [address]                                    [place], [place] (336) 342-8316 Therapy/tele-psych/case    °[facility] [address]  ° [place], [place] (336) 349-2233    °Dr. Arfeen  (336) 349-4544   °[facility] [place]  [employer] [place] [facility] 1) [address], [place] °2) [address], [place] °3)  [address], [place] (336) 349-3220 °(336) 342-7768 ° °(336) 342-8140   °[place] [facility] (336) 342-1394 or (336) 342-3537 (After Hours)    ° ° °

## 2013-11-19 NOTE — ED Provider Notes (Signed)
CSN: [id number]     Arrival date & time 11/12/13  0809 History   First MD Initiated Contact with Patient 11/12/13 [phone]     Chief Complaint  Patient presents with  . Dental Abscess   . Depression     (Consider location/radiation/quality/duration/timing/severity/associated sxs/prior Treatment) HPI  33 year old male with left facial pain and swelling. Patient was recently seen for toothache/dental infection in the emergency room a couple days ago. Prescribe antibiotics and given pain medicine. Reports symptoms have not significantly improved. No fevers or chills. Has not been able to follow-up with dentist or oral surgeon since last evaluated. No difficulty breathing or swallowing.  History reviewed. No pertinent past medical history. Past Surgical History  Procedure Laterality Date  . Root canal     History reviewed. No pertinent family history. History  Substance Use Topics  . Smoking status: Current Some Day Smoker  . Smokeless tobacco: Never Used  . Alcohol Use: Yes     Comment: occ    Review of Systems  All systems reviewed and negative, other than as noted in HPI.   Allergies  Shellfish allergy  Home Medications   Prior to Admission medications   Medication Sig Start Date End Date Taking? Authorizing Provider  amoxicillin (AMOXIL) 500 MG capsule Take 1 capsule (500 mg total) by mouth 3 (three) times daily. 11/11/13  Yes Tatyana A Kirichenko, PA-C  HYDROcodone-acetaminophen (NORCO/VICODIN) 5-325 MG per tablet Take 1 tablet by mouth every 4 (four) hours as needed for moderate pain or severe pain. 11/11/13  Yes Tatyana A Kirichenko, PA-C  ibuprofen (ADVIL,MOTRIN) 600 MG tablet Take 1 tablet (600 mg total) by mouth every 6 (six) hours as needed. 11/11/13  Yes Tatyana A Kirichenko, PA-C   BP 117/73  Pulse 78  Temp(Src) 98.7 F (37.1 C) (Oral)  Resp 14  SpO2 99% Physical Exam  Nursing note and vitals reviewed. Constitutional: He appears well-developed and  well-nourished. No distress.  HENT:  Minimal left facial fullness. Tenderness along the gumline below the left lower premolars. No discrete drainable collection. Posterior pharynx is clear. Uvula is midline. Handling secretions. Normal sounding phonation. Neck is supple.  Eyes: Conjunctivae are normal. Pupils are equal, round, and reactive to light. Right eye exhibits no discharge. Left eye exhibits no discharge.  Neck: Neck supple.  Cardiovascular: Normal rate, regular rhythm and normal heart sounds.  Exam reveals no gallop and no friction rub.   No murmur heard. Pulmonary/Chest: Effort normal and breath sounds normal. No respiratory distress.  Abdominal: Soft. He exhibits no distension. There is no tenderness.  Musculoskeletal: He exhibits no edema and no tenderness.  Neurological: He is alert.  Skin: Skin is warm and dry.  Psychiatric: He has a normal mood and affect. His behavior is normal. Thought content normal.    ED Course  Procedures (including critical care time) Labs Review Labs Reviewed - No data to display  Imaging Review No results found.   EKG Interpretation None      MDM   Final diagnoses:  Dental implant pain, subsequent encounter    33 year old male with dental pain. Recently seen for the same. He has some facial swelling, but no discrete drainable collection my exam. No evidence of significant airway compromise. Plan continued antibiotics. As needed pain medication. Dental follow-up. Patient also citing increasing stress. She denies any suicidal or homicidal ideation. He is never spoke with anybody concerning these issues before. Is not interested in speaking with TTS today. Resource list was provided.  Raeford RazorStephen Savian Mazon, MD 11/19/13 0930

## 2016-08-12 ENCOUNTER — Emergency Department (HOSPITAL_COMMUNITY)
Admission: EM | Admit: 2016-08-12 | Discharge: 2016-08-13 | Disposition: A | Discharge status: Home / Self Care | Payer: BLUE CROSS/BLUE SHIELD | Attending: Emergency Medicine | Admitting: Emergency Medicine

## 2016-08-12 ENCOUNTER — Encounter (HOSPITAL_COMMUNITY): Payer: Self-pay | Admitting: Emergency Medicine

## 2016-08-12 DIAGNOSIS — F17200 Nicotine dependence, unspecified, uncomplicated: Secondary | ICD-10-CM | POA: Insufficient documentation

## 2016-08-12 DIAGNOSIS — H60392 Other infective otitis externa, left ear: Secondary | ICD-10-CM

## 2016-08-12 DIAGNOSIS — Z79899 Other long term (current) drug therapy: Secondary | ICD-10-CM | POA: Insufficient documentation

## 2016-08-12 NOTE — ED Triage Notes (Signed)
Pt comes in with complaints of left ear pain that started over a week ago.  Denies drainage or hearing changes.  States he does clean his ears with a wash cloth but not q-tips. Small white particle noted in ear upon assessment.

## 2016-08-13 MED ORDER — OFLOXACIN 0.3 % OT SOLN: 10.0000 [drp] | Freq: Every day | OTIC | 0 refills | Status: AC

## 2016-08-13 NOTE — ED Notes (Signed)
Pt ambulatory and independent at discharge.  Verbalized understanding of discharge instructions 

## 2016-08-13 NOTE — ED Provider Notes (Signed)
[facility]-EMERGENCY DEPT Provider Note   CSN: [id number] Arrival date & time: 08/12/16  2300     History   Chief Complaint Chief Complaint  Patient presents with  . Otalgia    HPI Jacob Snow is a 36 y.o. male presenting with 2 weeks of foreign body sensation in the left ear which has been intermittent and at times achy. Patient reports progressive worsening over the past week and he decided to get seen. He denies fever, chills, congestion, or other symptoms.  HPI  History reviewed. No pertinent past medical history.  There are no active problems to display for this patient.   Past Surgical History:  Procedure Laterality Date  . ROOT CANAL         Home Medications    Prior to Admission medications   Medication Sig Start Date End Date Taking? Authorizing Provider  amoxicillin (AMOXIL) 500 MG capsule Take 1 capsule (500 mg total) by mouth 3 (three) times daily. 11/11/13   Kirichenko, [place] Fillersatyana, PA-C  HYDROcodone-acetaminophen (NORCO/VICODIN) 5-325 MG per tablet Take 1 tablet by mouth every 4 (four) hours as needed for moderate pain or severe pain. 11/11/13   Kirichenko, Tatyana, PA-C  ibuprofen (ADVIL,MOTRIN) 600 MG tablet Take 1 tablet (600 mg total) by mouth every 6 (six) hours as needed. 11/11/13   Kirichenko, Tatyana, PA-C  ofloxacin (FLOXIN) 0.3 % OTIC solution Place 10 drops into the left ear daily. 08/13/16 08/20/16  Georgiana ShoreMitchell, Zakariye Nee B, PA-C    Family History No family history on file.  Social History Social History  Substance Use Topics  . Smoking status: Current Some Day Smoker  . Smokeless tobacco: Never Used  . Alcohol use Yes     Comment: occ     Allergies   Shellfish allergy   Review of Systems Review of Systems  Constitutional: Negative for chills and fever.  HENT: Positive for ear pain. Negative for ear discharge, facial swelling, hearing loss, sinus pain, sinus pressure and sore throat.   Respiratory: Negative for cough, choking, chest  tightness, shortness of breath, wheezing and stridor.   Cardiovascular: Negative for chest pain and palpitations.  Gastrointestinal: Negative for abdominal pain, nausea and vomiting.  Musculoskeletal: Negative for arthralgias, back pain, joint swelling, myalgias, neck pain and neck stiffness.  Skin: Negative for color change, pallor, rash and wound.  Neurological: Negative for dizziness and headaches.     Physical Exam Updated Vital Signs BP 122/68 (BP Location: Left Arm)   Pulse 61   Temp 97.7 F (36.5 C) (Oral)   Resp 18   Ht 5\' 7"  (1.702 m)   Wt 71.7 kg (158 lb)   SpO2 100%   BMI 24.75 kg/m   Physical Exam  Constitutional: He appears well-developed and well-nourished. No distress.  Afebrile, nontoxic-appearing, sitting comfortably in chair in no acute distress.  HENT:  Head: Normocephalic and atraumatic.  Right Ear: External ear normal.  Left Ear: External ear normal.  Mouth/Throat: Oropharynx is clear and moist. No oropharyngeal exudate.  Normal tympanic membranes bilaterally. Left ear canal with small white unidentified foreign body. Small raised pustule at 6 o'clock midway into the left ear canal.  Eyes: Conjunctivae and EOM are normal.  Neck: Normal range of motion. Neck supple.  Cardiovascular: Normal rate, regular rhythm and normal heart sounds.   No murmur heard. Pulmonary/Chest: Effort normal and breath sounds normal. No stridor. No respiratory distress. He has no wheezes. He has no rales.  Abdominal: Soft. There is no tenderness.  Musculoskeletal: He exhibits no  edema.  Lymphadenopathy:    He has no cervical adenopathy.  Neurological: He is alert.  Skin: Skin is warm and dry. No rash noted. He is not diaphoretic. No erythema. No pallor.  Psychiatric: He has a normal mood and affect.  Nursing note and vitals reviewed.    ED Treatments / Results  Labs (all labs ordered are listed, but only abnormal results are displayed) Labs Reviewed - No data to  display  EKG  EKG Interpretation None       Radiology No results found.  Procedures Procedures (including critical care time)    Medications Ordered in ED Medications - No data to display   Initial Impression / Assessment and Plan / ED Course  I have reviewed the triage vital signs and the nursing notes.  Pertinent labs & imaging results that were available during my care of the patient were reviewed by me and considered in my medical decision making (see chart for details).    Patient presents with left ear discomfort and sensation of foreign body. On exam the ear canal appears irritated with small pustule which spontaneously drained when attempting to remove foreign body with curette.  Tympanic membranes are normal. Patient is afebrile nontoxic and well-appearing.  Will discharge with ear drops and follow-up with PCP as needed.  Discussed strict return precautions and advised to return to the emergency department if experiencing any new or worsening symptoms. Instructions were understood and patient agreed with discharge plan.  Final Clinical Impressions(s) / ED Diagnoses   Final diagnoses:  Other infective acute otitis externa of left ear    New Prescriptions Discharge Medication List as of 08/13/2016 12:55 AM    START taking these medications   Details  ofloxacin (FLOXIN) 0.3 % OTIC solution Place 10 drops into the left ear daily., Starting Sun 08/13/2016, Until Sun 08/20/2016, Print         Georgiana Shore, PA-C 08/13/16 6045    Raeford Razor, MD 08/20/16 [phone]

## 2016-08-13 NOTE — Discharge Instructions (Signed)
As discussed, use the ear drops daily. 10 drops once a day in the left ear. Follow up with a primary care provider as needed. Avoid putting anything in your ears including q-tips.  Return if symptoms worsen, pain increases, discharge from the ear, fever, chills, or other concerning symptoms in the meantime.

## 2017-01-29 ENCOUNTER — Ambulatory Visit: Payer: Self-pay | Admitting: Physician Assistant

## 2017-06-20 DIAGNOSIS — R0981 Nasal congestion: Secondary | ICD-10-CM | POA: Diagnosis present

## 2017-06-20 DIAGNOSIS — J069 Acute upper respiratory infection, unspecified: Secondary | ICD-10-CM | POA: Insufficient documentation

## 2017-06-20 DIAGNOSIS — F17200 Nicotine dependence, unspecified, uncomplicated: Secondary | ICD-10-CM | POA: Diagnosis not present

## 2017-06-20 DIAGNOSIS — M545 Low back pain: Secondary | ICD-10-CM | POA: Diagnosis not present

## 2017-06-21 ENCOUNTER — Emergency Department (HOSPITAL_COMMUNITY)
Admission: EM | Admit: 2017-06-21 | Discharge: 2017-06-21 | Disposition: A | Discharge status: Home / Self Care | Payer: BC Managed Care – PPO | Attending: Emergency Medicine | Admitting: Emergency Medicine

## 2017-06-21 ENCOUNTER — Emergency Department (HOSPITAL_COMMUNITY): Payer: BC Managed Care – PPO

## 2017-06-21 ENCOUNTER — Encounter (HOSPITAL_COMMUNITY): Payer: Self-pay

## 2017-06-21 DIAGNOSIS — M545 Low back pain, unspecified: Secondary | ICD-10-CM

## 2017-06-21 DIAGNOSIS — J069 Acute upper respiratory infection, unspecified: Secondary | ICD-10-CM

## 2017-06-21 LAB — URINALYSIS, ROUTINE W REFLEX MICROSCOPIC
Bilirubin Urine: NEGATIVE
Glucose, UA: NEGATIVE mg/dL
Hgb urine dipstick: NEGATIVE
KETONES UR: 5 mg/dL — AB
Leukocytes, UA: NEGATIVE
NITRITE: NEGATIVE
PROTEIN: NEGATIVE mg/dL
Specific Gravity, Urine: 1.034 — ABNORMAL HIGH (ref 1.005–1.030)
pH: 5 (ref 5.0–8.0)

## 2017-06-21 LAB — GROUP A STREP BY PCR: GROUP A STREP BY PCR: NOT DETECTED

## 2017-06-21 MED ORDER — PREDNISONE 20 MG PO TABS
60.0000 mg | ORAL_TABLET | Freq: Once | ORAL | Status: AC
  Administered 2017-06-21: 60 mg via ORAL
  Filled 2017-06-21: qty 3

## 2017-06-21 MED ORDER — FEXOFENADINE-PSEUDOEPHED ER 60-120 MG PO TB12: 1.0000 | ORAL_TABLET | Freq: Two times a day (BID) | ORAL | 0 refills | Status: DC

## 2017-06-21 MED ORDER — FLUTICASONE PROPIONATE 50 MCG/ACT NA SUSP: 1.0000 | Freq: Every day | NASAL | 0 refills | Status: DC

## 2017-06-21 NOTE — ED Provider Notes (Signed)
[facility]-EMERGENCY DEPT Provider Note   CSN: [id number] Arrival date & time: 06/20/17  2318     History   Chief Complaint Chief Complaint  Patient presents with  . flu like sx    HPI Jacob Snow is a 37 y.o. male.  Patient presents to the emergency department for evaluation of multiple problems.  Patient reports that for the last 2 days he has had significant sinus congestion and a sore throat.  He has not noticed fever.  He has not had any cough, chest congestion associated with the symptoms.  Patient also reports right flank discomfort.  This is been ongoing for 3 or 4 weeks.  He reports that this comes and goes, has not identified anything that causes it.  He has not had any urinary symptoms.  No history of kidney stone.     History reviewed. No pertinent past medical history.  There are no active problems to display for this patient.   Past Surgical History:  Procedure Laterality Date  . ROOT CANAL          Home Medications    Prior to Admission medications   Medication Sig Start Date End Date Taking? Authorizing Provider  ibuprofen (ADVIL,MOTRIN) 200 MG tablet Take 400 mg by mouth every 6 (six) hours as needed for headache or moderate pain.   Yes [provider]  fexofenadine-pseudoephedrine (ALLEGRA-D) 60-120 MG 12 hr tablet Take 1 tablet by mouth every 12 (twelve) hours. 06/21/17   Gilda Crease, MD  fluticasone (FLONASE) 50 MCG/ACT nasal spray Place 1 spray into both nostrils daily. 06/21/17   Gilda Crease, MD    Family History History reviewed. No pertinent family history.  Social History Social History   Tobacco Use  . Smoking status: Current Some Day Smoker  . Smokeless tobacco: Never Used  Substance Use Topics  . Alcohol use: Yes    Comment: occ  . Drug use: No     Allergies   Shellfish allergy   Review of Systems Review of Systems  HENT: Positive for congestion and sore throat.     Genitourinary: Positive for flank pain.  All other systems reviewed and are negative.    Physical Exam Updated Vital Signs BP 116/81   Pulse 72   Temp 98.1 F (36.7 C) (Oral)   Resp 18   SpO2 95%   Physical Exam  Constitutional: He is oriented to person, place, and time. He appears well-developed and well-nourished. No distress.  HENT:  Head: Normocephalic and atraumatic.  Right Ear: Hearing normal.  Left Ear: Hearing normal.  Nose: Nose normal.  Mouth/Throat: Mucous membranes are normal. Posterior oropharyngeal erythema present. No oropharyngeal exudate or posterior oropharyngeal edema.  Eyes: Pupils are equal, round, and reactive to light. Conjunctivae and EOM are normal.  Neck: Normal range of motion. Neck supple.  Cardiovascular: Regular rhythm, S1 normal and S2 normal. Exam reveals no gallop and no friction rub.  No murmur heard. Pulmonary/Chest: Effort normal and breath sounds normal. No respiratory distress. He exhibits no tenderness.  Abdominal: Soft. Normal appearance and bowel sounds are normal. There is no hepatosplenomegaly. There is no tenderness. There is no rebound, no guarding, no tenderness at McBurney's point and negative Murphy's sign. No hernia.  Musculoskeletal: Normal range of motion.  Neurological: He is alert and oriented to person, place, and time. He has normal strength. No cranial nerve deficit or sensory deficit. Coordination normal. GCS eye subscore is 4. GCS verbal subscore is 5.  GCS motor subscore is 6.  Skin: Skin is warm, dry and intact. No rash noted. No cyanosis.  Psychiatric: He has a normal mood and affect. His speech is normal and behavior is normal. Thought content normal.  Nursing note and vitals reviewed.    ED Treatments / Results  Labs (all labs ordered are listed, but only abnormal results are displayed) Labs Reviewed  URINALYSIS, ROUTINE W REFLEX MICROSCOPIC - Abnormal; Notable for the following components:      Result Value    Specific Gravity, Urine 1.034 (*)    Ketones, ur 5 (*)    All other components within normal limits  GROUP A STREP BY PCR    EKG None  Radiology Dg Chest 2 View  Result Date: 06/21/2017 CLINICAL DATA:  Fever, cough, body aches EXAM: CHEST - 2 VIEW COMPARISON:  None FINDINGS: Lungs are clear.  No pleural effusion or pneumothorax. The heart is normal in size. Visualized osseous structures are within normal limits. IMPRESSION: Normal chest radiographs. Electronically Signed   By: Charline Bills M.D.   On: 06/21/2017 01:18    Procedures Procedures (including critical care time)  Medications Ordered in ED Medications  predniSONE (DELTASONE) tablet 60 mg (has no administration in time range)     Initial Impression / Assessment and Plan / ED Course  I have reviewed the triage vital signs and the nursing notes.  Pertinent labs & imaging results that were available during my care of the patient were reviewed by me and considered in my medical decision making (see chart for details).     Patient presents to the emergency department with multiple complaints.  Patient complaining predominantly of upper history infection symptoms.  He has significant nasal congestion, mucosal edema.  There is mild erythema the oral pharynx, consistent with postnasal drip, no evidence of exudate.  Strep negative.  Patient is not experiencing cough or chest congestion, lungs are clear.  Patient also complaining of right lower back and flank pain.  The area is nontender to the touch and he does not have much pain with movement.  He is not currently experiencing the pain.  He reports that it comes and goes.  Urinalysis is clear today, no microscopic hematuria, no sign of infection.  Final Clinical Impressions(s) / ED Diagnoses   Final diagnoses:  Upper respiratory tract infection, unspecified type  Acute right-sided low back pain without sciatica    ED Discharge Orders        Ordered     fexofenadine-pseudoephedrine (ALLEGRA-D) 60-120 MG 12 hr tablet  Every 12 hours     06/21/17 0353    fluticasone (FLONASE) 50 MCG/ACT nasal spray  Daily     06/21/17 0353       Gilda Crease, MD 06/21/17 [phone]

## 2017-06-21 NOTE — ED Notes (Signed)
Bed: WTR7 Expected date:  Expected time:  Means of arrival:  Comments: 

## 2017-06-26 ENCOUNTER — Ambulatory Visit: Payer: BC Managed Care – PPO | Admitting: Sports Medicine

## 2017-12-04 ENCOUNTER — Encounter (HOSPITAL_COMMUNITY): Payer: Self-pay | Admitting: Emergency Medicine

## 2017-12-04 ENCOUNTER — Other Ambulatory Visit: Payer: Self-pay

## 2017-12-04 ENCOUNTER — Emergency Department (HOSPITAL_COMMUNITY)
Admission: EM | Admit: 2017-12-04 | Discharge: 2017-12-04 | Disposition: A | Discharge status: Home / Self Care | Payer: BC Managed Care – PPO | Attending: Emergency Medicine | Admitting: Emergency Medicine

## 2017-12-04 DIAGNOSIS — Z79899 Other long term (current) drug therapy: Secondary | ICD-10-CM | POA: Insufficient documentation

## 2017-12-04 DIAGNOSIS — R109 Unspecified abdominal pain: Secondary | ICD-10-CM | POA: Diagnosis present

## 2017-12-04 DIAGNOSIS — R1030 Lower abdominal pain, unspecified: Secondary | ICD-10-CM | POA: Diagnosis not present

## 2017-12-04 DIAGNOSIS — F17210 Nicotine dependence, cigarettes, uncomplicated: Secondary | ICD-10-CM | POA: Insufficient documentation

## 2017-12-04 LAB — URINALYSIS, ROUTINE W REFLEX MICROSCOPIC
BILIRUBIN URINE: NEGATIVE
Glucose, UA: NEGATIVE mg/dL
HGB URINE DIPSTICK: NEGATIVE
KETONES UR: NEGATIVE mg/dL
Leukocytes, UA: NEGATIVE
NITRITE: NEGATIVE
PROTEIN: NEGATIVE mg/dL
Specific Gravity, Urine: 1.028 (ref 1.005–1.030)
pH: 7 (ref 5.0–8.0)

## 2017-12-04 NOTE — ED Triage Notes (Signed)
Pt endorses right and left sided groin pain x 2 weeks. Pt reports it is 7/10 and intermittent. Pt denies any injury or heavy lifting. Pt denies urinary symptoms like burning, stinging or discharge. Pt was seen at a clinic for STD workup and he endorses it was negative. Pt denies swelling to groin.

## 2017-12-04 NOTE — ED Notes (Signed)
ED Provider at bedside. 

## 2017-12-04 NOTE — ED Provider Notes (Signed)
[facility] EMERGENCY DEPARTMENT Provider Note   CSN: [id number] Arrival date & time: 12/04/17  1413     History   Chief Complaint Chief Complaint  Patient presents with  . Groin Pain    HPI Jacob Snow is a 37 y.o. male.  HPI  37 yo male presents today with groin pressure and discomfort for two weeks.  Symptoms have been constant- nothing seems to make it better or worse.  No urethral discharge noted. Patient ports that he had STDs checked at the health department on November 4 and was told that they were negative.  He reports that he has been previously on prep due to his sexual orientation.  He denies that he has had any unprotected sex.  The abdominal pain, nausea, vomiting, or diarrhea.  He denies any increased urinary frequency or pain with urination.  History reviewed. No pertinent past medical history.  There are no active problems to display for this patient.   Past Surgical History:  Procedure Laterality Date  . ROOT CANAL          Home Medications    Prior to Admission medications   Medication Sig Start Date End Date Taking? Authorizing Provider  fexofenadine-pseudoephedrine (ALLEGRA-D) 60-120 MG 12 hr tablet Take 1 tablet by mouth every 12 (twelve) hours. 06/21/17   Gilda Crease, MD  fluticasone (FLONASE) 50 MCG/ACT nasal spray Place 1 spray into both nostrils daily. 06/21/17   Gilda Crease, MD  ibuprofen (ADVIL,MOTRIN) 200 MG tablet Take 400 mg by mouth every 6 (six) hours as needed for headache or moderate pain.    [provider]    Family History History reviewed. No pertinent family history.  Social History Social History   Tobacco Use  . Smoking status: Current Some Day Smoker  . Smokeless tobacco: Never Used  Substance Use Topics  . Alcohol use: Yes    Comment: occ  . Drug use: No     Allergies   Shellfish allergy   Review of Systems Review of Systems  All other systems reviewed and  are negative.    Physical Exam Updated Vital Signs BP 137/82 (BP Location: Right Arm)   Pulse 75   Temp 97.7 F (36.5 C) (Oral)   Resp 16   Ht 1.702 m (5\' 7" )   Wt 77.1 kg   SpO2 98%   BMI 26.63 kg/m   Physical Exam  Constitutional: He is oriented to person, place, and time. He appears well-developed and well-nourished.  HENT:  Head: Normocephalic and atraumatic.  Eyes: Pupils are equal, round, and reactive to light. EOM are normal.  Neck: Normal range of motion. Neck supple.  Cardiovascular: Normal rate and regular rhythm.  Pulmonary/Chest: Effort normal and breath sounds normal.  Abdominal: Soft. Bowel sounds are normal.  Genitourinary: Rectum normal, prostate normal and penis normal. No penile tenderness.  Genitourinary Comments: Some diffuse inguinal adenopathy, no significant enlargement, no other swelling, redness, or ttp No urethral discharge noted.  Musculoskeletal: Normal range of motion.  Neurological: He is alert and oriented to person, place, and time.  Skin: Skin is warm. Capillary refill takes less than 2 seconds.  Psychiatric: He has a normal mood and affect.  Nursing note and vitals reviewed.    ED Treatments / Results  Labs (all labs ordered are listed, but only abnormal results are displayed) Labs Reviewed  URINALYSIS, ROUTINE W REFLEX MICROSCOPIC  GC/CHLAMYDIA PROBE AMP ([place]) NOT AT [facility]    EKG None  Radiology No results found.  Procedures Procedures (including critical care time)  Medications Ordered in ED Medications - No data to display   Initial Impression / Assessment and Plan / ED Course  I have reviewed the triage vital signs and the nursing notes.  Pertinent labs & imaging results that were available during my care of the patient were reviewed by me and considered in my medical decision making (see chart for details).     37 yo male complaining of inguinal pressure- no evidence of hernia, orchitis, torsion, penile  abnormalities.  Patient recently had std check but recent gc chlamydia here. Urine is clear Patient to follow up op with pmd Discussed return precautions and that we need an accurate number to reach in case std check is positive.  Patient voices understanding.   Final Clinical Impressions(s) / ED Diagnoses   Final diagnoses:  Inguinal pain, unspecified laterality    ED Discharge Orders    None       Margarita Grizzleay, Caley Volkert, MD 12/04/17 1515

## 2017-12-04 NOTE — Discharge Instructions (Addendum)
Urine is clear Please make sure we have accurate phone number to contact you Call number on d/c papers to establish primary care.

## 2017-12-04 NOTE — ED Notes (Signed)
Declined W/C at D/C and was escorted to lobby by RN. 

## 2017-12-04 NOTE — ED Notes (Signed)
Patient verbalizes understanding of discharge instructions. Opportunity for questioning and answers were provided. Armband removed by staff, pt discharged from ED.  Pt ambulatory.   

## 2017-12-05 LAB — GC/CHLAMYDIA PROBE AMP (~~LOC~~) NOT AT ARMC
CHLAMYDIA, DNA PROBE: NEGATIVE
NEISSERIA GONORRHEA: NEGATIVE

## 2017-12-26 ENCOUNTER — Emergency Department (HOSPITAL_COMMUNITY)
Admission: EM | Admit: 2017-12-26 | Discharge: 2017-12-26 | Discharge status: Against Medical Advice | Payer: BC Managed Care – PPO | Attending: Emergency Medicine | Admitting: Emergency Medicine

## 2017-12-26 ENCOUNTER — Encounter (HOSPITAL_COMMUNITY): Payer: Self-pay | Admitting: Emergency Medicine

## 2017-12-26 DIAGNOSIS — Z5321 Procedure and treatment not carried out due to patient leaving prior to being seen by health care provider: Secondary | ICD-10-CM | POA: Diagnosis not present

## 2017-12-26 DIAGNOSIS — R229 Localized swelling, mass and lump, unspecified: Secondary | ICD-10-CM | POA: Diagnosis not present

## 2017-12-26 NOTE — ED Triage Notes (Signed)
Pt c/o bumps in bilat axilla areas that have been there for while. Tried changing deodorant but didn't help. Bumps are becoming painful now and patient wants to get them looked at.

## 2018-01-09 ENCOUNTER — Other Ambulatory Visit: Payer: Self-pay

## 2018-01-09 ENCOUNTER — Emergency Department (HOSPITAL_COMMUNITY)
Admission: EM | Admit: 2018-01-09 | Discharge: 2018-01-09 | Disposition: A | Discharge status: Home / Self Care | Payer: BC Managed Care – PPO | Attending: Emergency Medicine | Admitting: Emergency Medicine

## 2018-01-09 ENCOUNTER — Encounter (HOSPITAL_COMMUNITY): Payer: Self-pay

## 2018-01-09 DIAGNOSIS — R590 Localized enlarged lymph nodes: Secondary | ICD-10-CM | POA: Diagnosis not present

## 2018-01-09 DIAGNOSIS — Z79899 Other long term (current) drug therapy: Secondary | ICD-10-CM | POA: Insufficient documentation

## 2018-01-09 DIAGNOSIS — R21 Rash and other nonspecific skin eruption: Secondary | ICD-10-CM | POA: Diagnosis present

## 2018-01-09 DIAGNOSIS — F17200 Nicotine dependence, unspecified, uncomplicated: Secondary | ICD-10-CM | POA: Insufficient documentation

## 2018-01-09 NOTE — Discharge Instructions (Signed)
Please read and follow all provided instructions.  Your diagnoses today include:  1. Axillary lymphadenopathy     Tests performed today include:  Vital signs. See below for your results today.   Medications prescribed:  Please use over-the-counter NSAID medications (ibuprofen, naproxen) as directed on the packaging for pain.   Take any prescribed medications only as directed.  Home care instructions:  Follow any educational materials contained in this packet.  BE VERY CAREFUL not to take multiple medicines containing Tylenol (also called acetaminophen). Doing so can lead to an overdose which can damage your liver and cause liver failure and possibly death.   Follow-up instructions: Please follow-up with your primary care provider in the next 2-3 weeks if swelling doesn't spontaneously resolve.  Return instructions:   Please return to the Emergency Department if you experience worsening symptoms.   Please return if you have any other emergent concerns.  Additional Information:  Your vital signs today were: BP 112/81 (BP Location: Right Arm)    Pulse (!) 55    Temp (!) 97.5 F (36.4 C) (Oral)    Resp 16    Ht 5\' 7"  (1.702 m)    Wt 74.8 kg    SpO2 97%    BMI 25.84 kg/m  If your blood pressure (BP) was elevated above 135/85 this visit, please have this repeated by your doctor within one month. --------------

## 2018-01-09 NOTE — ED Notes (Signed)
Bed: [facility] Expected date:  Expected time:  Means of arrival:  Comments: 

## 2018-01-09 NOTE — ED Triage Notes (Signed)
Pt states he has lumps under his arms that come and go. Pt states he has had them for about 6 months. Pt checked in the for same 2 weeks ago, but LWBS.

## 2018-01-09 NOTE — ED Provider Notes (Signed)
[facility]-EMERGENCY DEPT Provider Note   CSN: [id number] Arrival date & time: 01/09/18  0809     History   Chief Complaint Chief Complaint  Patient presents with  . Rash    HPI Jacob Snow is a 37 y.o. male.  Patient with no significant medical history presents the emergency department with tender swollen lumps in his right axilla.  Symptoms started a few days ago.  States that he has had intermittent symptoms in both armpits over the past several months.  He denies any injuries to the right upper extremity or cuts to the skin.  He denies fevers, nausea, vomiting.  No night sweats or weight loss.  Patient denies history of HIV or other immunocompromising illness stating that he has been tested.  Patient occasionally will take PrEP, however is not currently on any medications. The onset of this condition was acute. The course is constant. Aggravating factors: none. Alleviating factors: none. Denies any swelling in the inguinal area.       History reviewed. No pertinent past medical history.  There are no active problems to display for this patient.   Past Surgical History:  Procedure Laterality Date  . ROOT CANAL          Home Medications    Prior to Admission medications   Medication Sig Start Date End Date Taking? Authorizing Provider  fexofenadine-pseudoephedrine (ALLEGRA-D) 60-120 MG 12 hr tablet Take 1 tablet by mouth every 12 (twelve) hours. 06/21/17   Gilda CreasePollina, Christopher J, MD  fluticasone (FLONASE) 50 MCG/ACT nasal spray Place 1 spray into both nostrils daily. 06/21/17   Gilda CreasePollina, Christopher J, MD  ibuprofen (ADVIL,MOTRIN) 200 MG tablet Take 400 mg by mouth every 6 (six) hours as needed for headache or moderate pain.    [provider]    Family History No family history on file.  Social History Social History   Tobacco Use  . Smoking status: Current Some Day Smoker  . Smokeless tobacco: Never Used  Substance Use Topics    . Alcohol use: Yes    Comment: occ  . Drug use: No     Allergies   Shellfish allergy   Review of Systems Review of Systems  Constitutional: Negative for chills, diaphoresis and fever.  HENT: Negative for voice change.   Respiratory: Negative for cough and shortness of breath.   Musculoskeletal: Negative for myalgias.  Skin: Negative for wound.  Hematological: Positive for adenopathy.     Physical Exam Updated Vital Signs BP 112/81 (BP Location: Right Arm)   Pulse (!) 55   Temp (!) 97.5 F (36.4 C) (Oral)   Resp 16   Ht 5\' 7"  (1.702 m)   Wt 74.8 kg   SpO2 97%   BMI 25.84 kg/m   Physical Exam Vitals signs and nursing note reviewed.  Constitutional:      Appearance: He is well-developed.  HENT:     Head: Normocephalic and atraumatic.  Eyes:     Conjunctiva/sclera: Conjunctivae normal.  Neck:     Musculoskeletal: Normal range of motion and neck supple.  Pulmonary:     Effort: No respiratory distress.  Lymphadenopathy:     Upper Body:     Right upper body: Axillary adenopathy present. No supraclavicular adenopathy.     Left upper body: No supraclavicular or axillary adenopathy.  Skin:    General: Skin is warm and dry.  Neurological:     Mental Status: He is alert.      ED Treatments /  Results  Labs (all labs ordered are listed, but only abnormal results are displayed) Labs Reviewed - No data to display  EKG None  Radiology No results found.  Procedures Procedures (including critical care time)  Medications Ordered in ED Medications - No data to display   Initial Impression / Assessment and Plan / ED Course  I have reviewed the triage vital signs and the nursing notes.  Pertinent labs & imaging results that were available during my care of the patient were reviewed by me and considered in my medical decision making (see chart for details).     Patient seen and examined.   Vital signs reviewed and are as follows: BP 112/81 (BP Location:  Right Arm)   Pulse (!) 55   Temp (!) 97.5 F (36.4 C) (Oral)   Resp 16   Ht 5\' 7"  (1.702 m)   Wt 74.8 kg   SpO2 97%   BMI 25.84 kg/m   Patient counseled.  Advised use of NSAIDs and warm compresses on the area as needed.  Discussed that if areas recur or do not improve in the next 2 weeks that he should follow-up with a primary care physician for further evaluation.  Final Clinical Impressions(s) / ED Diagnoses   Final diagnoses:  Axillary lymphadenopathy   Patient with several mobile, mildly tender lymph nodes in the right axilla.  Symptoms have been occurring for several days however he has had similar symptoms in the past.  Patient without "B" symptoms.  No known history of HIV or immune compromise.  Do not feel that further work-up indicated at this time with symptoms recur or persist.  ED Discharge Orders    None       Renne Crigler, PA-C 01/09/18 0930    Alvira Monday, MD 01/10/18 1109

## 2018-09-01 ENCOUNTER — Other Ambulatory Visit: Payer: Self-pay

## 2018-09-01 ENCOUNTER — Encounter (HOSPITAL_COMMUNITY): Payer: Self-pay | Admitting: Emergency Medicine

## 2018-09-01 ENCOUNTER — Emergency Department (HOSPITAL_COMMUNITY)
Admission: EM | Admit: 2018-09-01 | Discharge: 2018-09-02 | Disposition: A | Discharge status: Home / Self Care | Payer: BC Managed Care – PPO | Attending: Emergency Medicine | Admitting: Emergency Medicine

## 2018-09-01 DIAGNOSIS — R51 Headache: Secondary | ICD-10-CM | POA: Insufficient documentation

## 2018-09-01 DIAGNOSIS — Z720 Tobacco use: Secondary | ICD-10-CM | POA: Diagnosis not present

## 2018-09-01 DIAGNOSIS — Z20828 Contact with and (suspected) exposure to other viral communicable diseases: Secondary | ICD-10-CM | POA: Insufficient documentation

## 2018-09-01 DIAGNOSIS — R5083 Postvaccination fever: Secondary | ICD-10-CM | POA: Insufficient documentation

## 2018-09-01 DIAGNOSIS — R5381 Other malaise: Secondary | ICD-10-CM | POA: Insufficient documentation

## 2018-09-01 DIAGNOSIS — R110 Nausea: Secondary | ICD-10-CM | POA: Diagnosis not present

## 2018-09-01 DIAGNOSIS — R5081 Fever presenting with conditions classified elsewhere: Secondary | ICD-10-CM

## 2018-09-01 MED ORDER — ACETAMINOPHEN 325 MG PO TABS
650.0000 mg | ORAL_TABLET | Freq: Once | ORAL | Status: DC | PRN
  Filled 2018-09-01: qty 2

## 2018-09-01 NOTE — ED Triage Notes (Signed)
Pt c/o headache and fever x 3 days. 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

## 2018-09-02 NOTE — Discharge Instructions (Addendum)
You may take over-the-counter medicine for symptomatic relief, such as Tylenol, Motrin, TheraFlu, Alka seltzer , black elderberry, etc. Please limit acetaminophen (Tylenol) to 4000 mg and Ibuprofen (Motrin, Advil, etc.) to 2400 mg for a 24hr period. Please note that other over-the-counter medicine may contain acetaminophen or ibuprofen as a component of their ingredients.   

## 2018-09-02 NOTE — ED Provider Notes (Signed)
[facility] EMERGENCY DEPARTMENT Provider Note  CSN: [id number] Arrival date & time: 09/01/18 2139  Chief Complaint(s) Headache and Fever  HPI Jacob Snow is a 38 y.o. male here with 3 days for generalized malaise, headache, and subjective fever. Endorses associated nausea. No cough, sore throat, chest pain, SOB, abd pain, emesis, diarrhea, urinary symptoms, or stiff neck. Reports known Covid + at work. Also reports that he got his MMR and Tdap shot just prior to symptom onset.  HPI  Past Medical History History reviewed. No pertinent past medical history. There are no active problems to display for this patient.  Home Medication(s) Prior to Admission medications   Medication Sig Start Date End Date Taking? Authorizing Provider  fexofenadine-pseudoephedrine (ALLEGRA-D) 60-120 MG 12 hr tablet Take 1 tablet by mouth every 12 (twelve) hours. 06/21/17   Orpah Greek, MD  fluticasone (FLONASE) 50 MCG/ACT nasal spray Place 1 spray into both nostrils daily. 06/21/17   Orpah Greek, MD  ibuprofen (ADVIL,MOTRIN) 200 MG tablet Take 400 mg by mouth every 6 (six) hours as needed for headache or moderate pain.    [provider]                                                                                                                                    Past Surgical History Past Surgical History:  Procedure Laterality Date  . ROOT CANAL     Family History No family history on file.  Social History Social History   Tobacco Use  . Smoking status: Current Some Day Smoker  . Smokeless tobacco: Never Used  Substance Use Topics  . Alcohol use: Yes    Comment: occ  . Drug use: No   Allergies Shellfish allergy  Review of Systems Review of Systems All other systems are reviewed and are negative for acute change except as noted in the HPI Physical Exam Vital Signs  I have reviewed the triage vital signs BP 111/72 (BP Location: Right Arm)    Pulse 67   Temp 98.8 F (37.1 C) (Oral)   Resp 17   SpO2 100%   Physical Exam Vitals signs reviewed.  Constitutional:      General: He is not in acute distress.    Appearance: He is well-developed. He is not diaphoretic.  HENT:     Head: Normocephalic and atraumatic.     Right Ear: A middle ear effusion is present. No mastoid tenderness. Tympanic membrane is not erythematous.     Left Ear:  No middle ear effusion. No mastoid tenderness. Tympanic membrane is not erythematous.     Nose: Nose normal.     Mouth/Throat:     Pharynx: Oropharynx is clear.  Eyes:     General: No scleral icterus.       Right eye: No discharge.        Left eye: No discharge.     Conjunctiva/sclera:  Conjunctivae normal.     Pupils: Pupils are equal, round, and reactive to light.  Neck:     Musculoskeletal: Normal range of motion and neck supple.  Cardiovascular:     Rate and Rhythm: Normal rate and regular rhythm.     Heart sounds: No murmur. No friction rub. No gallop.   Pulmonary:     Effort: Pulmonary effort is normal. No respiratory distress.     Breath sounds: Normal breath sounds. No stridor. No rales.  Abdominal:     General: There is no distension.     Palpations: Abdomen is soft.     Tenderness: There is no abdominal tenderness.  Musculoskeletal:        General: No tenderness.  Skin:    General: Skin is warm and dry.     Findings: No erythema or rash.  Neurological:     Mental Status: He is alert and oriented to person, place, and time.     ED Results and Treatments Labs (all labs ordered are listed, but only abnormal results are displayed) Labs Reviewed  NOVEL CORONAVIRUS, NAA (HOSPITAL ORDER, SEND-OUT TO REF LAB)                                                                                                                         EKG  EKG Interpretation  Date/Time:    Ventricular Rate:    PR Interval:    QRS Duration:   QT Interval:    QTC Calculation:   R Axis:      Text Interpretation:        Radiology No results found.  Pertinent labs & imaging results that were available during my care of the patient were reviewed by me and considered in my medical decision making (see chart for details).  Medications Ordered in ED Medications  acetaminophen (TYLENOL) tablet 650 mg (has no administration in time range)                                                                                                                                    Procedures Procedures  (including critical care time)  Medical Decision Making / ED Course I have reviewed the nursing notes for this encounter and the patient's prior records (if available in EHR or on provided paperwork).   Jacob Snow was evaluated in Emergency Department on 09/02/2018 for the symptoms described in the history  of present illness. He was evaluated in the context of the global COVID-19 pandemic, which necessitated consideration that the patient might be at risk for infection with the SARS-CoV-2 virus that causes COVID-19. Institutional protocols and algorithms that pertain to the evaluation of patients at risk for COVID-19 are in a state of rapid change based on information released by regulatory bodies including the CDC and federal and state organizations. These policies and algorithms were followed during the patient's care in the ED.  Patient presents with viral symptoms for 3 days.  adequate oral hydration. Rest of history as above.  Patient appears well. No signs of toxicity, patient is interactive. No hypoxia, tachypnea or other signs of respiratory distress. No sign of clinical dehydration. Lung exam clear. Rest of exam as above.  Most consistent with viral illness vs reactive from immunizations. Possible COVID  No evidence suggestive of pharyngitis, AOM, PNA, or meningitis.  Chest x-ray not indicated at this time.  Patient evaluated, tested and sent home with instructions for home care  and Quarantine. Instructed to seek further care if symptoms worsen.  Is set up with follow up for a virtual visit with PCP in next 24-48 hours.   Discussed symptomatic treatment with the patient and they will follow closely with their PCP.        Final Clinical Impression(s) / ED Diagnoses Final diagnoses:  Fever in other diseases  Malaise    The patient appears reasonably screened and/or stabilized for discharge and I doubt any other medical condition or other [facility] requiring further screening, evaluation, or treatment in the ED at this time prior to discharge.  Disposition: Discharge  Condition: Good  I have discussed the results, Dx and Tx plan with the patient who expressed understanding and agree(s) with the plan. Discharge instructions discussed at great length. The patient was given strict return precautions who verbalized understanding of the instructions. No further questions at time of discharge.    ED Discharge Orders    None        Follow Up: Primary care provider  Schedule an appointment as soon as possible for a visit  If you do not have a primary care physician, contact [facility] at [phone] for referral      This chart was dictated using voice recognition software.  Despite best efforts to proofread,  errors can occur which can change the documentation meaning.   Fatima Blank, MD 09/02/18 [phone]

## 2018-09-03 LAB — NOVEL CORONAVIRUS, NAA (HOSP ORDER, SEND-OUT TO REF LAB; TAT 18-24 HRS): SARS-CoV-2, NAA: NOT DETECTED

## 2019-05-02 ENCOUNTER — Emergency Department (HOSPITAL_COMMUNITY): Payer: BC Managed Care – PPO

## 2019-05-02 ENCOUNTER — Emergency Department (HOSPITAL_COMMUNITY)
Admission: EM | Admit: 2019-05-02 | Discharge: 2019-05-03 | Disposition: A | Discharge status: Home / Self Care | Payer: BC Managed Care – PPO | Attending: Emergency Medicine | Admitting: Emergency Medicine

## 2019-05-02 ENCOUNTER — Encounter (HOSPITAL_COMMUNITY): Payer: Self-pay | Admitting: Emergency Medicine

## 2019-05-02 ENCOUNTER — Other Ambulatory Visit: Payer: Self-pay

## 2019-05-02 DIAGNOSIS — R109 Unspecified abdominal pain: Secondary | ICD-10-CM

## 2019-05-02 DIAGNOSIS — R112 Nausea with vomiting, unspecified: Secondary | ICD-10-CM

## 2019-05-02 DIAGNOSIS — Z20822 Contact with and (suspected) exposure to covid-19: Secondary | ICD-10-CM | POA: Diagnosis not present

## 2019-05-02 DIAGNOSIS — R1031 Right lower quadrant pain: Secondary | ICD-10-CM | POA: Insufficient documentation

## 2019-05-02 DIAGNOSIS — F17200 Nicotine dependence, unspecified, uncomplicated: Secondary | ICD-10-CM | POA: Diagnosis not present

## 2019-05-02 DIAGNOSIS — Z79899 Other long term (current) drug therapy: Secondary | ICD-10-CM | POA: Diagnosis not present

## 2019-05-02 DIAGNOSIS — R197 Diarrhea, unspecified: Secondary | ICD-10-CM

## 2019-05-02 LAB — CBC
HCT: 46.4 % (ref 39.0–52.0)
Hemoglobin: 15.5 g/dL (ref 13.0–17.0)
MCH: 31.1 pg (ref 26.0–34.0)
MCHC: 33.4 g/dL (ref 30.0–36.0)
MCV: 93 fL (ref 80.0–100.0)
Platelets: 164 10*3/uL (ref 150–400)
RBC: 4.99 MIL/uL (ref 4.22–5.81)
RDW: 13.7 % (ref 11.5–15.5)
WBC: 5.3 10*3/uL (ref 4.0–10.5)
nRBC: 0 % (ref 0.0–0.2)

## 2019-05-02 LAB — LIPASE, BLOOD: Lipase: 39 U/L (ref 11–51)

## 2019-05-02 LAB — COMPREHENSIVE METABOLIC PANEL
ALT: 25 U/L (ref 0–44)
AST: 29 U/L (ref 15–41)
Albumin: 4.1 g/dL (ref 3.5–5.0)
Alkaline Phosphatase: 67 U/L (ref 38–126)
Anion gap: 7 (ref 5–15)
BUN: 19 mg/dL (ref 6–20)
CO2: 28 mmol/L (ref 22–32)
Calcium: 9 mg/dL (ref 8.9–10.3)
Chloride: 101 mmol/L (ref 98–111)
Creatinine, Ser: 1.36 mg/dL — ABNORMAL HIGH (ref 0.61–1.24)
GFR calc Af Amer: >60 mL/min (ref >60)
GFR calc non Af Amer: >60 mL/min (ref >60)
Glucose, Bld: 109 mg/dL — ABNORMAL HIGH (ref 70–99)
Potassium: 3.8 mmol/L (ref 3.5–5.1)
Sodium: 136 mmol/L (ref 135–145)
Total Bilirubin: 0.8 mg/dL (ref 0.3–1.2)
Total Protein: 7.3 g/dL (ref 6.5–8.1)

## 2019-05-02 LAB — URINALYSIS, ROUTINE W REFLEX MICROSCOPIC
Bilirubin Urine: NEGATIVE
Glucose, UA: NEGATIVE mg/dL
Hgb urine dipstick: NEGATIVE
Ketones, ur: NEGATIVE mg/dL
Leukocytes,Ua: NEGATIVE
Nitrite: NEGATIVE
Protein, ur: NEGATIVE mg/dL
Specific Gravity, Urine: 1.029 (ref 1.005–1.030)
pH: 5 (ref 5.0–8.0)

## 2019-05-02 LAB — POC SARS CORONAVIRUS 2 AG -  ED: SARS Coronavirus 2 Ag: NEGATIVE

## 2019-05-02 MED ORDER — ONDANSETRON HCL 4 MG/2ML IJ SOLN
4.0000 mg | Freq: Once | INTRAMUSCULAR | Status: AC
  Administered 2019-05-02: 21:00:00 4 mg via INTRAVENOUS
  Filled 2019-05-02: qty 2

## 2019-05-02 MED ORDER — DICYCLOMINE HCL 20 MG PO TABS: 20.0000 mg | ORAL_TABLET | Freq: Three times a day (TID) | ORAL | 0 refills | Status: AC | PRN

## 2019-05-02 MED ORDER — ONDANSETRON 4 MG PO TBDP: 4.0000 mg | ORAL_TABLET | Freq: Three times a day (TID) | ORAL | 0 refills | Status: AC | PRN

## 2019-05-02 MED ORDER — MORPHINE SULFATE (PF) 4 MG/ML IV SOLN
4.0000 mg | Freq: Once | INTRAVENOUS | Status: AC
  Administered 2019-05-02: 21:00:00 4 mg via INTRAVENOUS
  Filled 2019-05-02: qty 1

## 2019-05-02 MED ORDER — ACETAMINOPHEN 325 MG PO TABS
650.0000 mg | ORAL_TABLET | Freq: Once | ORAL | Status: AC
  Administered 2019-05-02: 21:00:00 650 mg via ORAL
  Filled 2019-05-02: qty 2

## 2019-05-02 MED ORDER — SODIUM CHLORIDE (PF) 0.9 % IJ SOLN
INTRAMUSCULAR | Status: AC
  Filled 2019-05-02: qty 50

## 2019-05-02 MED ORDER — SODIUM CHLORIDE 0.9 % IV BOLUS
1000.0000 mL | Freq: Once | INTRAVENOUS | Status: AC
  Administered 2019-05-02: 21:00:00 1000 mL via INTRAVENOUS

## 2019-05-02 MED ORDER — IOHEXOL 300 MG/ML  SOLN
100.0000 mL | Freq: Once | INTRAMUSCULAR | Status: AC | PRN
  Administered 2019-05-02: 21:00:00 100 mL via INTRAVENOUS

## 2019-05-02 NOTE — ED Provider Notes (Signed)
[facility]-EMERGENCY DEPT Provider Note   CSN: [id number] Arrival date & time: 05/02/19  1648     History Chief Complaint  Patient presents with  . Abdominal Pain    Jacob Snow is a 39 y.o. male without significant past medical history who presents to the emergency department with complaints of N/V/D that started @ 0800 this AM.  Patient states he has felt poorly all day with general malaise, subjective fever, chills, body aches, nausea, 6 episodes of emesis, and 7-8 episodes of diarrhea.  He reports associated abdominal pain located in the right lower quadrant, constant, no alleviating or aggravating factors.  He does not know of anyone sick with similar symptoms.  Denies recent foreign travel, antibiotics, hospitalizations, or suspicious p.o. intake.  No drugs or alcohol within the past 24 hours.  Denies hematemesis, melena, hematochezia, dysuria, testicular pain/swelling, cough, dyspnea, or chest pain.  No prior abdominal surgeries.  HPI     History reviewed. No pertinent past medical history.  There are no problems to display for this patient.   Past Surgical History:  Procedure Laterality Date  . ROOT CANAL         No family history on file.  Social History   Tobacco Use  . Smoking status: Current Some Day Smoker  . Smokeless tobacco: Never Used  Substance Use Topics  . Alcohol use: Yes    Comment: occ  . Drug use: No    Home Medications Prior to Admission medications   Medication Sig Start Date End Date Taking? Authorizing Provider  fexofenadine-pseudoephedrine (ALLEGRA-D) 60-120 MG 12 hr tablet Take 1 tablet by mouth every 12 (twelve) hours. 06/21/17   Gilda Crease, MD  fluticasone (FLONASE) 50 MCG/ACT nasal spray Place 1 spray into both nostrils daily. 06/21/17   Gilda Crease, MD  ibuprofen (ADVIL,MOTRIN) 200 MG tablet Take 400 mg by mouth every 6 (six) hours as needed for headache or moderate pain.    [provider]    Allergies    Shellfish allergy  Review of Systems   Review of Systems  Constitutional: Positive for chills, fatigue and fever.  Respiratory: Negative for cough and shortness of breath.   Cardiovascular: Negative for chest pain.  Gastrointestinal: Positive for abdominal pain, diarrhea, nausea and vomiting. Negative for blood in stool and constipation.  Genitourinary: Negative for dysuria, penile swelling, scrotal swelling and testicular pain.  Musculoskeletal: Positive for myalgias.  Neurological: Negative for syncope.  All other systems reviewed and are negative.   Physical Exam Updated Vital Signs BP 113/73 (BP Location: Right Arm)   Pulse 67   Temp (!) 102.3 F (39.1 C) (Oral)   Resp 16   SpO2 98%   Physical Exam Vitals and nursing note reviewed.  Constitutional:      General: He is not in acute distress.    Appearance: He is well-developed. He is not toxic-appearing.  HENT:     Head: Normocephalic and atraumatic.  Eyes:     General:        Right eye: No discharge.        Left eye: No discharge.     Conjunctiva/sclera: Conjunctivae normal.  Cardiovascular:     Rate and Rhythm: Normal rate and regular rhythm.  Pulmonary:     Effort: Pulmonary effort is normal. No respiratory distress.     Breath sounds: Normal breath sounds. No wheezing, rhonchi or rales.  Abdominal:     General: There is no distension.  Palpations: Abdomen is soft.     Tenderness: There is abdominal tenderness in the right lower quadrant. There is no guarding or rebound.  Musculoskeletal:     Cervical back: Neck supple.  Skin:    General: Skin is warm and dry.     Findings: No rash.  Neurological:     Mental Status: He is alert.     Comments: Clear speech.   Psychiatric:        Behavior: Behavior normal.    ED Results / Procedures / Treatments   Labs (all labs ordered are listed, but only abnormal results are displayed) Labs Reviewed  COMPREHENSIVE METABOLIC PANEL  - Abnormal; Notable for the following components:      Result Value   Glucose, Bld 109 (*)    Creatinine, Ser 1.36 (*)    All other components within normal limits  LIPASE, BLOOD  CBC  URINALYSIS, ROUTINE W REFLEX MICROSCOPIC  POC SARS CORONAVIRUS 2 AG -  ED    EKG None  Radiology CT Abdomen Pelvis W Contrast  Result Date: 05/02/2019 CLINICAL DATA:  Nausea, vomiting, diarrhea and body aches. EXAM: CT ABDOMEN AND PELVIS WITH CONTRAST TECHNIQUE: Multidetector CT imaging of the abdomen and pelvis was performed using the standard protocol following bolus administration of intravenous contrast. CONTRAST:  OMNIPAQUE IOHEXOL 300 MG/ML  SOLN COMPARISON:  None. FINDINGS: Lower chest: No acute abnormality. Hepatobiliary: No focal liver abnormality is seen. No gallstones, gallbladder wall thickening, or biliary dilatation. Pancreas: Unremarkable. No pancreatic ductal dilatation or surrounding inflammatory changes. Spleen: Normal in size without focal abnormality. Adrenals/Urinary Tract: Adrenal glands are unremarkable. Kidneys are normal, without renal calculi, focal lesion, or hydronephrosis. Bladder is unremarkable. Stomach/Bowel: There is a small hiatal hernia. Appendix appears normal. No evidence of bowel wall thickening, distention, or inflammatory changes. Vascular/Lymphatic: No significant vascular findings are present. No enlarged abdominal or pelvic lymph nodes. Reproductive: Prostate is unremarkable. Other: No abdominal wall hernia or abnormality. No abdominopelvic ascites. Musculoskeletal: No acute or significant osseous findings. IMPRESSION: 1. No acute process in the abdomen or pelvis. 2. Small hiatal hernia. Electronically Signed   By: Aram Candela M.D.   On: 05/02/2019 21:38    Procedures Procedures (including critical care time)  Medications Ordered in ED Medications - No data to display  ED Course  I have reviewed the triage vital signs and the nursing notes.  Pertinent  labs & imaging results that were available during my care of the patient were reviewed by me and considered in my medical decision making (see chart for details).    Jacob Snow was evaluated in Emergency Department on 05/02/2019 for the symptoms described in the history of present illness. He/she was evaluated in the context of the global COVID-19 pandemic, which necessitated consideration that the patient might be at risk for infection with the SARS-CoV-2 virus that causes COVID-19. Institutional protocols and algorithms that pertain to the evaluation of patients at risk for COVID-19 are in a state of rapid change based on information released by regulatory bodies including the CDC and federal and state organizations. These policies and algorithms were followed during the patient's care in the ED.  MDM Rules/Calculators/A&P                      This patient presents to the ED for concern of N/V/D, abdominal pain, and generally not feeling well. Febrile, vitals otherwise unremarkable. Patient nontoxic, nonseptic appearing. Abdomen with RLQ tenderness, no peritoneal signs. The differential  diagnosis includes viral illness, appendicitis, cholecystitis, UTI/pyelo, pneumonia.   Additional history obtained:  Previous records obtained and reviewed.   Lab Tests:  I reviewed, and interpreted labs, which included: CBC: No anemia or leukocytosis.  CMP: elevated creatinine but improved from prior on record on chart review. No significant electrolyte derangement.  Lipase: WNL UA: No UTI.  Rapid COVID added on--> negative.   Imaging Studies ordered:  I ordered imaging studies which included CT A/P, I independently visualized and interpreted imaging which showed no acute process in the abdomen or pelvis.  Medicines ordered:  I ordered medication morphine, zofran, fluids, & tylenol for pain, nausea, and fever.    Reevaluation: 23:20: RE-Eval: After the interventions stated above, I reevaluated the  patient and found that he feels much better, he is tolerating PO, his abdominal exam remains without peritoneal signs- do not suspect acute surgical abdomen. Doubt appendicitis, cholecystitis, perforation, obstruction, or colitis.  Urinalysis not infection to raise concern for UTI/Pyelo.  Lungs are clear, no respiratory complaints, do not suspect pneumonia.  At this time feel viral illness is the most likely.  Given patient is tolerating p.o. and feeling improved feel he is reasonable for discharge.  Will send PCR Covid swab.  Discussed need for quarantine. I discussed results, treatment plan, need for follow-up, and return precautions with the patient. Provided opportunity for questions, patient confirmed understanding and is in agreement with plan.   Portions of this note were generated with [employer]. Dictation errors may occur despite best attempts at proofreading.  Final Clinical Impression(s) / ED Diagnoses Final diagnoses:  Nausea vomiting and diarrhea  Abdominal pain, unspecified abdominal location    Rx / DC Orders ED Discharge Orders         Ordered    ondansetron (ZOFRAN ODT) 4 MG disintegrating tablet  Every 8 hours PRN     05/02/19 2326    dicyclomine (BENTYL) 20 MG tablet  Every 8 hours PRN     05/02/19 2326           Maximino Cozzolino, Glynda Jaeger, PA-C 05/02/19 2328    Daleen Bo, MD 05/03/19 1312

## 2019-05-02 NOTE — Discharge Instructions (Signed)
You were seen in the emergency department today for abdominal pain, fever, nausea, vomiting, and diarrhea.  Your work-up was overall reassuring.  Your labs were normal with the exception that your kidney function was elevated, this is actually improved from prior labs you have had done but with this should be rechecked by primary care provider within 1 week.  Your CT scan did not show any significant new abnormalities, no signs of appendicitis.  Your rapid Covid test was negative, we have sent a more accurate test and will call you within the next 72 hours if positive, you may also view these results on MyChart.   We are instructing patient's with COVID 19 or symptoms of COVID 19 to quarantine themselves for 14 days. You may be able to discontinue self quarantine if the following conditions are met:   Persons with COVID-19 who have symptoms and were directed to care for themselves at home may discontinue home isolation under the  following conditions: - It has been at least 7 days have passed since symptoms first appeared. - AND at least 3 days (72 hours) have passed since recovery defined as resolution of fever without the use of fever-reducing medications and improvement in respiratory symptoms (e.g., cough, shortness of breath)  Please follow the below quarantine instructions.   We suspect that overall your symptoms are related to a virus.  We are sending you home with the following medicines: -Zofran: Take every hours as needed for nausea and vomiting -Bentyl: Take every 8 hours as needed for abdominal cramping.  We have prescribed you new medication(s) today. Discuss the medications prescribed today with your pharmacist as they can have adverse effects and interactions with your other medicines including over the counter and prescribed medications. Seek medical evaluation if you start to experience new or abnormal symptoms after taking one of these medicines, seek care immediately if you start  to experience difficulty breathing, feeling of your throat closing, facial swelling, or rash as these could be indications of a more serious allergic reaction  Please take Tylenol per over-the-counter dosing to help with discomfort and/or fevers.   Please follow up with primary care within 3-5 days for re-evaluation- call prior to going to the office to make them aware of your symptoms as some offices are altering their method of seeing patients with COVID 19 symptoms. Return to the ER for new or worsening symptoms including but not limited to increased work of breathing, chest pain, passing out, inability to keep fluids down, increased abdominal pain, blood in your vomit or stool, or any other concerns.       Person Under Monitoring Name: Jacob Snow  Location: [address] [address] [facility] [address]   Infection Prevention Recommendations for Individuals Confirmed to have, or Being Evaluated for, 2019 Novel Coronavirus (COVID-19) Infection Who Receive Care at Home  Individuals who are confirmed to have, or are being evaluated for, COVID-19 should follow the prevention steps below until a healthcare provider or local or state health department says they can return to normal activities.  Stay home except to get medical care You should restrict activities outside your home, except for getting medical care. Do not go to work, school, or public areas, and do not use public transportation or taxis.  Call ahead before visiting your doctor Before your medical appointment, call the healthcare provider and tell them that you have, or are being evaluated for, COVID-19 infection. This will help the healthcare provider's office take steps to keep other  people from getting infected. Ask your healthcare provider to call the local or state health department.  Monitor your symptoms Seek prompt medical attention if your illness is worsening (e.g., difficulty breathing). Before going to your  medical appointment, call the healthcare provider and tell them that you have, or are being evaluated for, COVID-19 infection. Ask your healthcare provider to call the local or state health department.  Wear a facemask You should wear a facemask that covers your nose and mouth when you are in the same room with other people and when you visit a healthcare provider. People who live with or visit you should also wear a facemask while they are in the same room with you.  Separate yourself from other people in your home As much as possible, you should stay in a different room from other people in your home. Also, you should use a separate bathroom, if available.  Avoid sharing household items You should not share dishes, drinking glasses, cups, eating utensils, towels, bedding, or other items with other people in your home. After using these items, you should wash them thoroughly with soap and water.  Cover your coughs and sneezes Cover your mouth and nose with a tissue when you cough or sneeze, or you can cough or sneeze into your sleeve. Throw used tissues in a lined trash can, and immediately wash your hands with soap and water for at least 20 seconds or use an alcohol-based hand rub.  Wash your [facility] your hands often and thoroughly with soap and water for at least 20 seconds. You can use an alcohol-based hand sanitizer if soap and water are not available and if your hands are not visibly dirty. Avoid touching your eyes, nose, and mouth with unwashed hands.   Prevention Steps for Caregivers and Household Members of Individuals Confirmed to have, or Being Evaluated for, COVID-19 Infection Being Cared for in the Home  If you live with, or provide care at home for, a person confirmed to have, or being evaluated for, COVID-19 infection please follow these guidelines to prevent infection:  Follow healthcare provider's instructions Make sure that you understand and can help the  patient follow any healthcare provider instructions for all care.  Provide for the patient's basic needs You should help the patient with basic needs in the home and provide support for getting groceries, prescriptions, and other personal needs.  Monitor the patient's symptoms If they are getting sicker, call his or her medical provider and tell them that the patient has, or is being evaluated for, COVID-19 infection. This will help the healthcare provider's office take steps to keep other people from getting infected. Ask the healthcare provider to call the local or state health department.  Limit the number of people who have contact with the patient If possible, have only one caregiver for the patient. Other household members should stay in another home or place of residence. If this is not possible, they should stay in another room, or be separated from the patient as much as possible. Use a separate bathroom, if available. Restrict visitors who do not have an essential need to be in the home.  Keep older adults, very young children, and other sick people away from the patient Keep older adults, very young children, and those who have compromised immune systems or chronic health conditions away from the patient. This includes people with chronic heart, lung, or kidney conditions, diabetes, and cancer.  Ensure good ventilation Make sure that shared spaces  in the home have good air flow, such as from an air conditioner or an opened window, weather permitting.  Wash your hands often Wash your hands often and thoroughly with soap and water for at least 20 seconds. You can use an alcohol based hand sanitizer if soap and water are not available and if your hands are not visibly dirty. Avoid touching your eyes, nose, and mouth with unwashed hands. Use disposable paper towels to dry your hands. If not available, use dedicated cloth towels and replace them when they become wet.  Wear a  facemask and gloves Wear a disposable facemask at all times in the room and gloves when you touch or have contact with the patient's blood, body fluids, and/or secretions or excretions, such as sweat, saliva, sputum, nasal mucus, vomit, urine, or feces.  Ensure the mask fits over your nose and mouth tightly, and do not touch it during use. Throw out disposable facemasks and gloves after using them. Do not reuse. Wash your hands immediately after removing your facemask and gloves. If your personal clothing becomes contaminated, carefully remove clothing and launder. Wash your hands after handling contaminated clothing. Place all used disposable facemasks, gloves, and other waste in a lined container before disposing them with other household waste. Remove gloves and wash your hands immediately after handling these items.  Do not share dishes, glasses, or other household items with the patient Avoid sharing household items. You should not share dishes, drinking glasses, cups, eating utensils, towels, bedding, or other items with a patient who is confirmed to have, or being evaluated for, COVID-19 infection. After the person uses these items, you should wash them thoroughly with soap and water.  Wash laundry thoroughly Immediately remove and wash clothes or bedding that have blood, body fluids, and/or secretions or excretions, such as sweat, saliva, sputum, nasal mucus, vomit, urine, or feces, on them. Wear gloves when handling laundry from the patient. Read and follow directions on labels of laundry or clothing items and detergent. In general, wash and dry with the warmest temperatures recommended on the label.  Clean all areas the individual has used often Clean all touchable surfaces, such as counters, tabletops, doorknobs, bathroom fixtures, toilets, phones, keyboards, tablets, and bedside tables, every day. Also, clean any surfaces that may have blood, body fluids, and/or secretions or excretions  on them. Wear gloves when cleaning surfaces the patient has come in contact with. Use a diluted bleach solution (e.g., dilute bleach with 1 part bleach and 10 parts water) or a household disinfectant with a label that says EPA-registered for coronaviruses. To make a bleach solution at home, add 1 tablespoon of bleach to 1 quart (4 cups) of water. For a larger supply, add  cup of bleach to 1 gallon (16 cups) of water. Read labels of cleaning products and follow recommendations provided on product labels. Labels contain instructions for safe and effective use of the cleaning product including precautions you should take when applying the product, such as wearing gloves or eye protection and making sure you have good ventilation during use of the product. Remove gloves and wash hands immediately after cleaning.  Monitor yourself for signs and symptoms of illness Caregivers and household members are considered close contacts, should monitor their health, and will be asked to limit movement outside of the home to the extent possible. Follow the monitoring steps for close contacts listed on the symptom monitoring form.   ? If you have additional questions, contact your local health department  or call the epidemiologist on call at [phone] (available 24/7). ? This guidance is subject to change. For the most up-to-date guidance from [facility], please refer to their website: [email or web address]

## 2019-05-02 NOTE — ED Triage Notes (Signed)
Per pt, states N/V/D and body aches since this am-abdominal pain as well

## 2019-05-03 LAB — SARS CORONAVIRUS 2 (TAT 6-24 HRS): SARS Coronavirus 2: NEGATIVE

## 2019-05-03 NOTE — ED Notes (Signed)
PT a/o and ambulatory at discharge. Pt stated he had a ride home. PT educated on fever management and prescriptions. PT verbalized understanding.
